# Patient Record
Sex: Male | Born: 1975 | Race: White | Hispanic: No | Marital: Married | Smoking: Former smoker
Health system: Southern US, Community
[De-identification: ages and names within clinical notes are randomized; demographics above are authoritative.]

## PROBLEM LIST (undated history)

## (undated) HISTORY — PX: TYMPANOSTOMY TUBE PLACEMENT: SHX32

---
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
## 2012-06-03 ENCOUNTER — Ambulatory Visit (INDEPENDENT_AMBULATORY_CARE_PROVIDER_SITE_OTHER): Payer: BC Managed Care – PPO | Admitting: Family Medicine

## 2012-06-03 ENCOUNTER — Encounter: Payer: Self-pay | Admitting: Family Medicine

## 2012-06-03 VITALS — BP 108/70 | HR 65 | Temp 97.8°F | Ht 72.5 in | Wt 177.4 lb

## 2012-06-03 DIAGNOSIS — M2141 Flat foot [pes planus] (acquired), right foot: Secondary | ICD-10-CM | POA: Insufficient documentation

## 2012-06-03 DIAGNOSIS — M2142 Flat foot [pes planus] (acquired), left foot: Secondary | ICD-10-CM | POA: Insufficient documentation

## 2012-06-03 DIAGNOSIS — M545 Low back pain: Secondary | ICD-10-CM | POA: Insufficient documentation

## 2012-06-03 DIAGNOSIS — M2140 Flat foot [pes planus] (acquired), unspecified foot: Secondary | ICD-10-CM

## 2012-06-03 MED ORDER — CYCLOBENZAPRINE HCL 10 MG PO TABS: 10.0000 mg | ORAL_TABLET | Freq: Three times a day (TID) | ORAL | Status: DC | PRN

## 2012-06-03 MED ORDER — NAPROXEN 500 MG PO TABS: 500.0000 mg | ORAL_TABLET | Freq: Two times a day (BID) | ORAL | Status: AC

## 2012-06-03 NOTE — Assessment & Plan Note (Signed)
New.  Refer to sports med for custom orthotics like pt had in [place].  Pt expressed understanding and is in agreement w/ plan.

## 2012-06-03 NOTE — Patient Instructions (Addendum)
Schedule your complete physical at your convenience Use the Naproxen (take w/ food) for inflammation and the flexeril as needed for muscle spasm We'll call you with your physical therapy and sports medicine appt for the orthotics Call with any questions or concerns Welcome!  We're glad to have you!!!

## 2012-06-03 NOTE — Progress Notes (Signed)
  Subjective:    Patient ID: Edwin Ryan, male    DOB: 01/09/1976, 37 y.o.   MRN: [id number]  HPI New to establish.  Recently moved from [place].  Works for [employer].  Back pain- pt reports he will have bad posture or hold shoulders high and then develop trap spasm and low back pain.  Was having PT in [place] and would like to continue this here.  Would also like home exercise program.  Periodically will have acute back pain and 'get stuck in the same position x2 days'.  Previously kept supply of anti-inflammatories and muscle relaxers for prn use.  Pt w/ flat feet and has previously required orthotics.  With prolonged running will develop knee and hip pain.  Would like to get new orthotics made.   Review of Systems For ROS see HPI     Objective:   Physical Exam  Vitals reviewed. Constitutional: He is oriented to person, place, and time. He appears well-developed and well-nourished. No distress.  HENT:  Head: Normocephalic and atraumatic.  Eyes: Conjunctivae normal and EOM are normal. Pupils are equal, round, and reactive to light.  Neck: Normal range of motion. Neck supple.  Cardiovascular: Normal rate, regular rhythm and normal heart sounds.   Pulmonary/Chest: Effort normal and breath sounds normal. No respiratory distress. He has no wheezes. He has no rales.  Musculoskeletal:       Full ROM of back (-) SLR bilaterally + mild trap spasm bilaterally FROM of neck  Neurological: He is alert and oriented to person, place, and time. He has normal reflexes. No cranial nerve deficit. Coordination normal.  Skin: Skin is warm and dry.  Psychiatric: He has a normal mood and affect. His behavior is normal.          Assessment & Plan:

## 2012-06-03 NOTE — Assessment & Plan Note (Signed)
New.  Pt w/ hx of this and was receiving PT in [place].  Would like to continue PT and re-establish home exercise plan.  Script for NSAIDs and flexeril given to use prn.  Reviewed supportive care and red flags that should prompt return.  Pt expressed understanding and is in agreement w/ plan.

## 2012-06-11 ENCOUNTER — Ambulatory Visit (INDEPENDENT_AMBULATORY_CARE_PROVIDER_SITE_OTHER): Payer: BC Managed Care – PPO | Admitting: Family Medicine

## 2012-06-11 ENCOUNTER — Encounter: Payer: Self-pay | Admitting: Family Medicine

## 2012-06-11 VITALS — BP 107/70 | HR 54 | Ht 73.0 in | Wt 179.0 lb

## 2012-06-11 DIAGNOSIS — M2140 Flat foot [pes planus] (acquired), unspecified foot: Secondary | ICD-10-CM

## 2012-06-11 DIAGNOSIS — R269 Unspecified abnormalities of gait and mobility: Secondary | ICD-10-CM

## 2012-06-11 DIAGNOSIS — M2141 Flat foot [pes planus] (acquired), right foot: Secondary | ICD-10-CM

## 2012-06-11 DIAGNOSIS — M79671 Pain in right foot: Secondary | ICD-10-CM

## 2012-06-11 DIAGNOSIS — M79609 Pain in unspecified limb: Secondary | ICD-10-CM

## 2012-06-14 ENCOUNTER — Ambulatory Visit: Payer: BC Managed Care – PPO | Attending: Family Medicine

## 2012-06-14 ENCOUNTER — Encounter: Payer: Self-pay | Admitting: Family Medicine

## 2012-06-14 DIAGNOSIS — M79672 Pain in left foot: Secondary | ICD-10-CM | POA: Insufficient documentation

## 2012-06-14 DIAGNOSIS — IMO0001 Reserved for inherently not codable concepts without codable children: Secondary | ICD-10-CM | POA: Insufficient documentation

## 2012-06-14 DIAGNOSIS — R269 Unspecified abnormalities of gait and mobility: Secondary | ICD-10-CM | POA: Insufficient documentation

## 2012-06-14 DIAGNOSIS — R293 Abnormal posture: Secondary | ICD-10-CM | POA: Insufficient documentation

## 2012-06-14 DIAGNOSIS — M2550 Pain in unspecified joint: Secondary | ICD-10-CM | POA: Insufficient documentation

## 2012-06-14 NOTE — Progress Notes (Signed)
Subjective:    Patient ID: Edwin Ryan, male    DOB: [birth year], 37 y.o.   MRN: [id number]  PCP: Dr. Beverely Low  HPI 37 yo M here for orthotics.  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 Through the course of his career he sustained multiple feet and ankle injuries (sprains). Completed physical therapy, nsaids, icing. Went on to have custom orthotics made which helped him significantly. Likes to exercise and run. Having problems with his back - is set to start PT next week as written by Dr. Beverely Low - advised to let me know in a month or so after if he's not improving. Will still occasionally get pain in feet and ankles but this is helped by the orthotics.  History reviewed. No pertinent past medical history.  Current Outpatient Prescriptions on File Prior to Visit  Medication Sig Dispense Refill  . cyclobenzaprine (FLEXERIL) 10 MG tablet Take 1 tablet (10 mg total) by mouth 3 (three) times daily as needed for muscle spasms.  30 tablet  0  . naproxen (NAPROSYN) 500 MG tablet Take 1 tablet (500 mg total) by mouth 2 (two) times daily with a meal.  60 tablet  0   No current facility-administered medications on file prior to visit.    Past Surgical History  Procedure Laterality Date  . Tympanostomy tube placement    . Broken wrist  1988  . Fracture surgery  1993    (L) thumb    No Known Allergies  History   Social History  . Marital Status: Married    Spouse Name: N/A    Number of Children: N/A  . Years of Education: N/A   Occupational History  . Not on file.   Social History Main Topics  . Smoking status: Former Smoker    Types: Cigarettes    Quit date: 04/03/2012  . Smokeless tobacco: Not on file  . Alcohol Use: Yes     Comment: 1 beer or wine per week  . Drug Use: No  . Sexually Active: Not on file   Other Topics Concern  . Not on file   Social History Narrative  . No narrative on file    Family  History  Problem Relation Age of Onset  . High blood pressure Mother   . Hypertension Mother   . High blood pressure Father   . Diabetes Father   . Heart disease Father   . Hypertension Father   . Hyperlipidemia Father   . Cancer Maternal Aunt     stomach,colon  . Cancer Maternal Uncle     lung,prostate  . High blood pressure Maternal Grandmother   . Stroke Maternal Grandmother   . High blood pressure Maternal Grandfather   . High blood pressure Paternal Grandmother   . Stroke Paternal Grandmother   . High blood pressure Paternal Grandfather   . Heart attack Neg Hx   . Sudden death Neg Hx     BP 107/70  Pulse 54  Ht 6\' 1"  (1.854 m)  Wt 179 lb (81.194 kg)  BMI 23.62 kg/m2 Review of Systems See HPI above.    Objective:   Physical Exam Gen: NAD  Bilateral feet/ankles: No gross deformity, swelling, ecchymoses Mod overpronation. No hallux rigidus or valgus. 2nd MT head collapsed with very mild callus bilaterally. FROM ankles without pain, 5/5 strength. No current TTP Negative ant drawer and talar tilt.   Negative syndesmotic compression. Thompsons test negative. NV intact distally.  Leg length 98 cm right, 97.75 cm left.    Assessment & Plan:  1. Bilateral foot/ankle pain - New custom orthotics made today. Patient was fitted for a : standard, cushioned, semi-rigid orthotic. The orthotic was heated and afterward the patient stood on the orthotic blank positioned on the orthotic stand. The patient was positioned in subtalar neutral position and 10 degrees of ankle dorsiflexion in a weight bearing stance. After completion of molding, a stable base was applied to the orthotic blank. The blank was ground to a stable position for weight bearing. Size: 12 blue swirl Base: blue med density EVA Posting: 1st ray posts to both orthotics for stability Additional orthotic padding: None Total prep time 45 minutes Patient felt much more comfortable with these and overpronation  controlled.  2. Gait abnormality with overpronation, pes planus - orthotics as noted above.

## 2012-06-14 NOTE — Assessment & Plan Note (Signed)
New custom orthotics made today. Patient was fitted for a : standard, cushioned, semi-rigid orthotic. The orthotic was heated and afterward the patient stood on the orthotic blank positioned on the orthotic stand. The patient was positioned in subtalar neutral position and 10 degrees of ankle dorsiflexion in a weight bearing stance. After completion of molding, a stable base was applied to the orthotic blank. The blank was ground to a stable position for weight bearing. Size: 12 blue swirl Base: blue med density EVA Posting: 1st ray posts to both orthotics for stability Additional orthotic padding: None Total prep time 45 minutes Patient felt much more comfortable with these and overpronation controlled. Note has a very small leg length inequality - not large enough to warrant lift in left orthotic.

## 2012-06-24 ENCOUNTER — Ambulatory Visit: Payer: BC Managed Care – PPO

## 2012-06-29 ENCOUNTER — Ambulatory Visit: Payer: BC Managed Care – PPO

## 2012-07-01 ENCOUNTER — Ambulatory Visit: Payer: BC Managed Care – PPO

## 2012-07-13 ENCOUNTER — Ambulatory Visit: Payer: BC Managed Care – PPO | Attending: Family Medicine

## 2012-07-13 DIAGNOSIS — R293 Abnormal posture: Secondary | ICD-10-CM | POA: Insufficient documentation

## 2012-07-13 DIAGNOSIS — M2550 Pain in unspecified joint: Secondary | ICD-10-CM | POA: Insufficient documentation

## 2012-07-13 DIAGNOSIS — IMO0001 Reserved for inherently not codable concepts without codable children: Secondary | ICD-10-CM | POA: Insufficient documentation

## 2012-07-14 ENCOUNTER — Encounter: Payer: Self-pay | Admitting: Lab

## 2012-07-15 ENCOUNTER — Ambulatory Visit (INDEPENDENT_AMBULATORY_CARE_PROVIDER_SITE_OTHER): Payer: BC Managed Care – PPO | Admitting: Family Medicine

## 2012-07-15 ENCOUNTER — Encounter: Payer: Self-pay | Admitting: Family Medicine

## 2012-07-15 VITALS — BP 100/70 | HR 64 | Temp 98.0°F | Ht 73.0 in | Wt 180.0 lb

## 2012-07-15 DIAGNOSIS — Z0000 Encounter for general adult medical examination without abnormal findings: Secondary | ICD-10-CM | POA: Insufficient documentation

## 2012-07-15 NOTE — Progress Notes (Signed)
  Subjective:    Patient ID: Edwin Ryan, male    DOB: [birth year], 37 y.o.   MRN: [id number]  HPI CPE- no concerns   Review of Systems Patient reports no vision/hearing changes, anorexia, fever ,adenopathy, persistant/recurrent hoarseness, swallowing issues, chest pain, palpitations, edema, persistant/recurrent cough, hemoptysis, dyspnea (rest,exertional, paroxysmal nocturnal), gastrointestinal  bleeding (melena, rectal bleeding), abdominal pain, excessive heart burn, GU symptoms (dysuria, hematuria, voiding/incontinence issues) syncope, focal weakness, memory loss, numbness & tingling, skin/hair/nail changes, depression, anxiety, abnormal bruising/bleeding, musculoskeletal symptoms/signs.     Objective:   Physical Exam BP 100/70  Pulse 64  Temp(Src) 98 F (36.7 C) (Oral)  Ht 6\' 1"  (1.854 m)  Wt 180 lb (81.647 kg)  BMI 23.75 kg/m2  General Appearance:    Alert, cooperative, no distress, appears stated age  Head:    Normocephalic, without obvious abnormality, atraumatic  Eyes:    PERRL, conjunctiva/corneas clear, EOM's intact, fundi    benign, both eyes       Ears:    Normal TM's and external ear canals, both ears  Nose:   Nares normal, septum midline, mucosa normal, no drainage   or sinus tenderness  Throat:   Lips, mucosa, and tongue normal; teeth and gums normal  Neck:   Supple, symmetrical, trachea midline, no adenopathy;       thyroid:  No enlargement/tenderness/nodules  Back:     Symmetric, no curvature, ROM normal, no CVA tenderness  Lungs:     Clear to auscultation bilaterally, respirations unlabored  Chest wall:    No tenderness or deformity  Heart:    Regular rate and rhythm, S1 and S2 normal, no murmur, rub   or gallop  Abdomen:     Soft, non-tender, bowel sounds active all four quadrants,    no masses, no organomegaly  Genitalia:    Normal male without lesion, discharge or tenderness  Rectal:    Deferred due to young age  Extremities:   Extremities normal,  atraumatic, no cyanosis or edema  Pulses:   2+ and symmetric all extremities  Skin:   Skin color, texture, turgor normal, no rashes or lesions  Lymph nodes:   Cervical, supraclavicular, and axillary nodes normal  Neurologic:   CNII-XII intact. Normal strength, sensation and reflexes      throughout          Assessment & Plan:

## 2012-07-15 NOTE — Patient Instructions (Addendum)
Follow up in 1 year or as needed Keep up the good work!  You look great! We'll notify you of your lab results Happy Spring!  (hopefully!)

## 2012-07-15 NOTE — Assessment & Plan Note (Signed)
Pt's PE WNL.  Check labs.  Anticipatory guidance provided.  

## 2012-07-16 LAB — CBC WITH DIFFERENTIAL/PLATELET
Basophils Absolute: 0 10*3/uL (ref 0.0–0.1)
Lymphs Abs: 1.9 10*3/uL (ref 0.7–4.0)
MCHC: 34.2 g/dL (ref 30.0–36.0)
Monocytes Absolute: 0.4 10*3/uL (ref 0.1–1.0)
Monocytes Relative: 6.5 % (ref 3.0–12.0)
Neutro Abs: 3.2 10*3/uL (ref 1.4–7.7)
Neutrophils Relative %: 58 % (ref 43.0–77.0)
Platelets: 184 10*3/uL (ref 150.0–400.0)
RBC: 4.73 Mil/uL (ref 4.22–5.81)
WBC: 5.5 10*3/uL (ref 4.5–10.5)

## 2012-07-16 LAB — HEPATIC FUNCTION PANEL
Albumin: 4.4 g/dL (ref 3.5–5.2)
Total Protein: 7.2 g/dL (ref 6.0–8.3)

## 2012-07-16 LAB — BASIC METABOLIC PANEL
CO2: 31 mEq/L (ref 19–32)
Calcium: 9.3 mg/dL (ref 8.4–10.5)
Creatinine, Ser: 1.1 mg/dL (ref 0.4–1.5)
GFR: 81.42 mL/min (ref >60.00)
Glucose, Bld: 84 mg/dL (ref 70–99)

## 2012-07-16 LAB — LIPID PANEL
Cholesterol: 155 mg/dL (ref 0–200)
Triglycerides: 205 mg/dL — ABNORMAL HIGH (ref 0.0–149.0)

## 2013-03-10 ENCOUNTER — Other Ambulatory Visit: Payer: Self-pay

## 2013-12-08 ENCOUNTER — Encounter: Payer: Self-pay | Admitting: Medical

## 2013-12-08 ENCOUNTER — Ambulatory Visit (INDEPENDENT_AMBULATORY_CARE_PROVIDER_SITE_OTHER): Payer: BC Managed Care – PPO | Admitting: Medical

## 2013-12-08 VITALS — BP 95/64 | HR 55 | Temp 98.4°F | Wt 195.0 lb

## 2013-12-08 DIAGNOSIS — J329 Chronic sinusitis, unspecified: Secondary | ICD-10-CM

## 2013-12-08 DIAGNOSIS — B9689 Other specified bacterial agents as the cause of diseases classified elsewhere: Secondary | ICD-10-CM

## 2013-12-08 DIAGNOSIS — A499 Bacterial infection, unspecified: Secondary | ICD-10-CM

## 2013-12-08 DIAGNOSIS — J309 Allergic rhinitis, unspecified: Secondary | ICD-10-CM

## 2013-12-08 MED ORDER — FLUTICASONE PROPIONATE 50 MCG/ACT NA SUSP: 2.0000 | Freq: Every day | NASAL | Status: DC

## 2013-12-08 MED ORDER — BENZONATATE 100 MG PO CAPS: 100.0000 mg | ORAL_CAPSULE | Freq: Three times a day (TID) | ORAL | Status: DC | PRN

## 2013-12-08 MED ORDER — CEFDINIR 300 MG PO CAPS: 300.0000 mg | ORAL_CAPSULE | Freq: Two times a day (BID) | ORAL | Status: DC

## 2013-12-08 NOTE — Assessment & Plan Note (Signed)
Following persistent untreated  allergy symptoms. Rx cefdnir. Also possible bronchitis. Rx of benzonatate for cough.

## 2013-12-08 NOTE — Patient Instructions (Signed)
It appears you have allergic rhinitis symptoms that  Have persisted  And now have sinusitis as well as probable bronchitis. I am sending you a prescription of fluticasone nasal spray to your pharmacy as well as a antibiotic and cough tablet. Please take medication. Follow up in 7 days for persisting symptoms or as needed for any worsening or changing symptoms.

## 2013-12-08 NOTE — Progress Notes (Signed)
Pre visit review using our clinic review tool, if applicable. No additional management support is needed unless otherwise documented below in the visit note. 

## 2013-12-08 NOTE — Assessment & Plan Note (Signed)
Rx fluticasone nasal spray and can get otc antihistamine if needed.

## 2013-12-08 NOTE — Progress Notes (Signed)
Subjective:    Patient ID: Edwin Ryan, male    DOB: [birth year], 38 y.o.   MRN: [id number]  HPI  Pt in sick for 7-10 days. He got sick on vacation. Start with nasal congestion, sneezing,  runny nose, sinus pressure and then progressive chest congestion. Now for 2 days coughing up mucous. Pt able to sleep.   Maybe some allergies in spring.   No past medical history on file.  History   Social History  . Marital Status: Married    Spouse Name: N/A    Number of Children: N/A  . Years of Education: N/A   Occupational History  . Not on file.   Social History Main Topics  . Smoking status: Former Smoker    Types: Cigarettes    Quit date: 04/03/2012  . Smokeless tobacco: Not on file  . Alcohol Use: Yes     Comment: 1 beer or wine per week  . Drug Use: No  . Sexual Activity: Not on file   Other Topics Concern  . Not on file   Social History Narrative  . No narrative on file    Past Surgical History  Procedure Laterality Date  . Tympanostomy tube placement    . Broken wrist  1988  . Fracture surgery  1993    (L) thumb    Family History  Problem Relation Age of Onset  . High blood pressure Mother   . Hypertension Mother   . High blood pressure Father   . Diabetes Father   . Heart disease Father   . Hypertension Father   . Hyperlipidemia Father   . Cancer Maternal Aunt     stomach,colon  . Cancer Maternal Uncle     lung,prostate  . High blood pressure Maternal Grandmother   . Stroke Maternal Grandmother   . High blood pressure Maternal Grandfather   . High blood pressure Paternal Grandmother   . Stroke Paternal Grandmother   . High blood pressure Paternal Grandfather   . Heart attack Neg Hx   . Sudden death Neg Hx     No Known Allergies  Current Outpatient Prescriptions on File Prior to Visit  Medication Sig Dispense Refill  . cyclobenzaprine (FLEXERIL) 10 MG tablet Take 1 tablet (10 mg total) by mouth 3 (three) times daily as needed for muscle  spasms.  30 tablet  0   No current facility-administered medications on file prior to visit.    BP 95/64  Pulse 55  Temp(Src) 98.4 F (36.9 C)  Wt 195 lb (88.451 kg)  SpO2 97%    Review of Systems  Constitutional: Negative for fever, chills and fatigue.  HENT: Positive for congestion, rhinorrhea, sinus pressure and sneezing. Negative for ear discharge, ear pain, facial swelling, mouth sores, postnasal drip, sore throat and tinnitus.   Respiratory: Positive for cough. Negative for chest tightness, shortness of breath and wheezing.   Cardiovascular: Negative for chest pain and palpitations.  Musculoskeletal: Negative.   Skin: Negative.   Neurological: Negative for dizziness, syncope, facial asymmetry, weakness, light-headedness, numbness and headaches.  Hematological: Negative for adenopathy. Does not bruise/bleed easily.       Objective:   Physical Exam  Constitutional: He is oriented to person, place, and time. He appears well-developed and well-nourished. No distress.  HENT:  Head: Normocephalic and atraumatic.  Right Ear: External ear normal.  Left Ear: External ear normal.  Mouth/Throat: Oropharynx is clear and moist.  Boggy turbinates. Faint maxillary sinus pressure. Post nasal drainage.  Eyes: Conjunctivae are normal. Pupils are equal, round, and reactive to light. Right eye exhibits no discharge. Left eye exhibits no discharge.  Neck: Normal range of motion. Neck supple.  Cardiovascular: Normal rate, regular rhythm and normal heart sounds.   Pulmonary/Chest: Effort normal and breath sounds normal. No respiratory distress. He has no wheezes. He has no rales. He exhibits no tenderness.  Abdominal: Soft. Bowel sounds are normal.  Neurological: He is alert and oriented to person, place, and time. No cranial nerve deficit. Coordination normal.  Skin: He is not diaphoretic.          Assessment & Plan:

## 2014-02-22 ENCOUNTER — Ambulatory Visit: Payer: BC Managed Care – PPO | Admitting: Family Medicine

## 2014-05-09 ENCOUNTER — Ambulatory Visit: Payer: Self-pay | Admitting: Family Medicine

## 2014-05-09 ENCOUNTER — Encounter: Payer: Self-pay | Admitting: Internal Medicine

## 2014-05-09 ENCOUNTER — Ambulatory Visit (INDEPENDENT_AMBULATORY_CARE_PROVIDER_SITE_OTHER): Payer: BLUE CROSS/BLUE SHIELD | Admitting: Internal Medicine

## 2014-05-09 VITALS — BP 117/76 | HR 62 | Temp 97.8°F | Ht 73.0 in | Wt 198.0 lb

## 2014-05-09 DIAGNOSIS — M545 Low back pain, unspecified: Secondary | ICD-10-CM

## 2014-05-09 MED ORDER — CYCLOBENZAPRINE HCL 10 MG PO TABS: 10.0000 mg | ORAL_TABLET | Freq: Every evening | ORAL | Status: DC | PRN

## 2014-05-09 MED ORDER — DICLOFENAC SODIUM 50 MG PO TBEC: 50.0000 mg | DELAYED_RELEASE_TABLET | Freq: Three times a day (TID) | ORAL | Status: DC | PRN

## 2014-05-09 NOTE — Patient Instructions (Signed)
Rest, use warm compress  Diclofenac 3 times a daya as needed for pain.  Always take it with food because may cause gastritis and ulcers.  If you notice nausea, stomach pain, change in the color of stools --->  Stop the medicine and let us know  Flexeril at bedtime, will cause drowsiness  Call if not improving in the next 10 days  Back exercises  [place] with information about home physical therapy for back pain: [email or web address]

## 2014-05-09 NOTE — Progress Notes (Signed)
Pre visit review using our clinic review tool, if applicable. No additional management support is needed unless otherwise documented below in the visit note. 

## 2014-05-09 NOTE — Progress Notes (Signed)
   Subjective:    Patient ID: Edwin Ryan, male    DOB: 05-03-1976, 39 y.o.   MRN: [id number]  DOS:  05/09/2014 Type of visit - description : acute Interval history: Symptoms started yesterday as he was standing up from a chair: Bilateral low back pain described as cramps, since then he is taking ibuprofen with some relief. This is not the first time this happened. No radiation.  ROS Denies actual injury or fall No lower extremity paresthesias No dysuria, gross hematuria difficulty urinating  History reviewed. No pertinent past medical history.  Past Surgical History  Procedure Laterality Date  . Tympanostomy tube placement    . Broken wrist  1988  . Fracture surgery  1993    (L) thumb    History   Social History  . Marital Status: Married    Spouse Name: N/A    Number of Children: N/A  . Years of Education: N/A   Occupational History  . Not on file.   Social History Main Topics  . Smoking status: Former Smoker    Types: Cigarettes    Quit date: 04/03/2012  . Smokeless tobacco: Not on file  . Alcohol Use: Yes     Comment: 1 beer or wine per week  . Drug Use: No  . Sexual Activity: Not on file   Other Topics Concern  . Not on file   Social History Narrative        Medication List       This list is accurate as of: 05/09/14 11:59 PM.  Always use your most recent med list.               cyclobenzaprine 10 MG tablet  Commonly known as:  FLEXERIL  Take 1 tablet (10 mg total) by mouth at bedtime as needed for muscle spasms.     diclofenac 50 MG EC tablet  Commonly known as:  VOLTAREN  Take 1 tablet (50 mg total) by mouth 3 (three) times daily as needed.     fluticasone 50 MCG/ACT nasal spray  Commonly known as:  FLONASE  Place 2 sprays into both nostrils daily.           Objective:   Physical Exam BP 117/76 mmHg  Pulse 62  Temp(Src) 97.8 F (36.6 C) (Oral)  Ht 6' 1"  (1.854 m)  Wt 198 lb (89.812 kg)  BMI 26.13 kg/m2  SpO2 96% General  -- alert, well-developed, NAD.  Extremities-- no pretibial edema bilaterally  Neurologic--  alert & oriented X3. Speech normal, posture and gait slightly antalgic, strength symmetric and appropriate for age. Straight leg test negative. DTRs symmetric. Psych-- Cognition and judgment appear intact. Cooperative with normal attention span and concentration. No anxious or depressed appearing.        Assessment & Plan:

## 2014-05-10 NOTE — Assessment & Plan Note (Signed)
Lumbar sprain, Conservative treatment, in the past the anti-inflammatory that worked the best for him was diclofenac, GI precautions discussed Flexeril Recommend also warm compresses Also physical therapy, see instructions, call if no better in few days

## 2014-12-07 ENCOUNTER — Encounter: Payer: Self-pay | Admitting: Medical

## 2014-12-07 ENCOUNTER — Ambulatory Visit (INDEPENDENT_AMBULATORY_CARE_PROVIDER_SITE_OTHER): Payer: BLUE CROSS/BLUE SHIELD | Admitting: Medical

## 2014-12-07 VITALS — BP 117/65 | HR 62 | Temp 98.3°F | Ht 73.0 in | Wt 186.8 lb

## 2014-12-07 DIAGNOSIS — J029 Acute pharyngitis, unspecified: Secondary | ICD-10-CM

## 2014-12-07 DIAGNOSIS — H9202 Otalgia, left ear: Secondary | ICD-10-CM

## 2014-12-07 DIAGNOSIS — H6502 Acute serous otitis media, left ear: Secondary | ICD-10-CM

## 2014-12-07 DIAGNOSIS — R0789 Other chest pain: Secondary | ICD-10-CM | POA: Diagnosis not present

## 2014-12-07 MED ORDER — AMOXICILLIN-POT CLAVULANATE 875-125 MG PO TABS: 1.0000 | ORAL_TABLET | Freq: Two times a day (BID) | ORAL | Status: DC

## 2014-12-07 NOTE — Progress Notes (Signed)
Pre visit review using our clinic review tool, if applicable. No additional management support is needed unless otherwise documented below in the visit note. 

## 2014-12-07 NOTE — Patient Instructions (Addendum)
For your lt ear looks infected I am prescribing augmentin antibiotic.  Your throat does not look that suspicious for infection presently. But antibiotic for your ear would help in the event you started with strep while you were on vacation.  For you mild atypical transient chest pain will get ekg. Your ekg looks norma(only brady cardia but resting ekg in person who regularly exercises)l. It is good that you can exercise and you note this does not induce pain. If your pain returns then would recommend evaluation at the time to get ekg while symptomatic. If after hour chest pain then ED.  If in future any recurrent intermittent chest pain. Then would consider referal to cardiology for possible stress testing or holter.  Follow up in 10 days or as needed  Also recommend wellness exam fasting so we can get labs and assess cardiac risk factors/lipid panel.

## 2014-12-07 NOTE — Progress Notes (Signed)
Subjective:    Patient ID: Edwin Ryan, male    DOB: 01-07-1976, 39 y.o.   MRN: [id number]  HPI   Recent vacation had some st on left side. Then left ear pain. Those symptoms about 2 wks ago. Throat still feels mild sore. Pt states his throat pain has been persisting. Left side still hurts mildly. Some intermittent dizziness over past 2 wks. But not now.  3 wks ago mild transient chest pain 5-10 minutes early am. Then went away. No description of classic cardiac type symptoms or associated symptoms. Then this went away. Then this Monday he had mild transient pain last 5 minutes then went away.  Recent air flights. But no leg pain. No sob or wheezing  Review of Systems  Constitutional: Negative for fever, chills, diaphoresis, activity change and fatigue.  HENT: Positive for ear pain and sore throat. Negative for congestion, drooling, postnasal drip, rhinorrhea, tinnitus and voice change.   Respiratory: Negative for cough, chest tightness and shortness of breath.   Cardiovascular: Negative for chest pain, palpitations and leg swelling.       None presently. Atypical chest pain 3 wk ago. This Monday. Very mild and limited.  Gastrointestinal: Negative for nausea, vomiting and abdominal pain.  Musculoskeletal: Negative for neck pain and neck stiffness.  Neurological: Negative for dizziness, tremors, seizures, syncope, facial asymmetry, speech difficulty, weakness, light-headedness, numbness and headaches.  Psychiatric/Behavioral: Negative for behavioral problems, confusion and agitation. The patient is not nervous/anxious.     No past medical history on file.  History   Social History  . Marital Status: Married    Spouse Name: N/A  . Number of Children: N/A  . Years of Education: N/A   Occupational History  . Not on file.   Social History Main Topics  . Smoking status: Former Smoker    Types: Cigarettes    Quit date: 04/03/2012  . Smokeless tobacco: Not on file  . Alcohol  Use: Yes     Comment: 1 beer or wine per week  . Drug Use: No  . Sexual Activity: Not on file   Other Topics Concern  . Not on file   Social History Narrative    Past Surgical History  Procedure Laterality Date  . Tympanostomy tube placement    . Broken wrist  1988  . Fracture surgery  1993    (L) thumb    Family History  Problem Relation Age of Onset  . High blood pressure Mother   . Hypertension Mother   . High blood pressure Father   . Diabetes Father   . Heart disease Father   . Hypertension Father   . Hyperlipidemia Father   . Cancer Maternal Aunt     stomach,colon  . Cancer Maternal Uncle     lung,prostate  . High blood pressure Maternal Grandmother   . Stroke Maternal Grandmother   . High blood pressure Maternal Grandfather   . High blood pressure Paternal Grandmother   . Stroke Paternal Grandmother   . High blood pressure Paternal Grandfather   . Heart attack Neg Hx   . Sudden death Neg Hx     No Known Allergies  No current outpatient prescriptions on file prior to visit.   No current facility-administered medications on file prior to visit.    BP 117/65 mmHg  Pulse 62  Temp(Src) 98.3 F (36.8 C) (Oral)  Ht 6' 1"  (1.854 m)  Wt 186 lb 12.8 oz (84.732 kg)  BMI 24.65 kg/m2  SpO2 100%       Objective:   Physical Exam  General  Mental Status - Alert. General Appearance - Well groomed. Not in acute distress.  Skin Rashes- No Rashes.  HEENT Head- Normal. Ear Auditory Canal - Left- Normal. Right - Normal.Tympanic Membrane- Left- moderate bright red upper 2/3 portion of tm Right- half of tm mild red appearance. Eye Sclera/Conjunctiva- Left- Normal. Right- Normal. Nose & Sinuses Nasal Mucosa- Left-  Not boggy or Congested. Right-  Not  boggy or Congested. Mouth & Throat Lips: Upper Lip- Normal: no dryness, cracking, pallor, cyanosis, or vesicular eruption. Lower Lip-Normal: no dryness, cracking, pallor, cyanosis or vesicular  eruption. Buccal Mucosa- Bilateral- No Aphthous ulcers. Oropharynx- No Discharge or Erythema. Tonsils: Characteristics- Bilateral- faint  Erythema . Size/Enlargement- Bilateral- 1+ enlargement at best. Discharge- bilateral-None.  Neck Neck- Supple. No Masses.   Chest and Lung Exam Auscultation: Breath Sounds:- even and unlabored, but bilateral upper lobe rhonchi.  Cardiovascular Auscultation:Rythm- Regular, rate and rhythm. Murmurs & Other Heart Sounds:Ausculatation of the heart reveal- No Murmurs.  Lymphatic Head & Neck General Head & Neck Lymphatics: Bilateral: Description- No Localized lymphadenopathy. Of neck.  Anterior chest wall- not tender to palpation.       Assessment & Plan:  For your lt ear looks infected I am prescribing augmentin antibiotic.  Your throat does not look that suspicious for infection presently. But antibiotic for your ear would help in the event you started with strep while you were on vacation.  For you mild atypical transient chest pain will get ekg. Your ekg looks norma(only brady cardia but resting ekg in person who regularly exercises)l. It is good that you can exercise and you note this does not induce pain. If your pain returns then would recommend evaluation at the time to get ekg while symptomatic. If after hour chest pain then ED.  If in future any recurrent intermittent chest pain. Then would consider referal to cardiology for possible stress testing or holter.  Follow up in 10 days or as needed  Also recommend wellness exam fasting so we can get labs and assess cardiac risk factors/lipid panel.

## 2015-07-03 ENCOUNTER — Telehealth: Payer: Self-pay | Admitting: Family Medicine

## 2015-07-03 NOTE — Telephone Encounter (Signed)
Pt declined flu shot

## 2015-07-03 NOTE — Telephone Encounter (Signed)
lvm for pt to call back regarding flu shot

## 2015-07-03 NOTE — Telephone Encounter (Signed)
Chart updated to reflect 

## 2016-07-25 ENCOUNTER — Telehealth: Payer: Self-pay | Admitting: Family Medicine

## 2016-07-25 NOTE — Telephone Encounter (Signed)
Pt would like to transfer care from Dr. Birdie Riddle to [place], Ronald states that he would like to have a male provider, please advise ok to schedule.

## 2016-07-25 NOTE — Telephone Encounter (Signed)
Ok with me 

## 2016-07-25 NOTE — Telephone Encounter (Signed)
[place] w/ me

## 2016-07-25 NOTE — Telephone Encounter (Signed)
Called pt and LMOVM to return call.

## 2016-07-29 ENCOUNTER — Encounter: Payer: Self-pay | Admitting: Physician Assistant

## 2016-07-29 ENCOUNTER — Ambulatory Visit (INDEPENDENT_AMBULATORY_CARE_PROVIDER_SITE_OTHER): Payer: BLUE CROSS/BLUE SHIELD | Admitting: Physician Assistant

## 2016-07-29 VITALS — BP 102/64 | HR 76 | Temp 98.5°F | Resp 14 | Ht 74.0 in | Wt 188.0 lb

## 2016-07-29 DIAGNOSIS — M79604 Pain in right leg: Secondary | ICD-10-CM

## 2016-07-29 DIAGNOSIS — M25551 Pain in right hip: Secondary | ICD-10-CM | POA: Insufficient documentation

## 2016-07-29 DIAGNOSIS — Z0000 Encounter for general adult medical examination without abnormal findings: Secondary | ICD-10-CM

## 2016-07-29 DIAGNOSIS — M79641 Pain in right hand: Secondary | ICD-10-CM | POA: Diagnosis not present

## 2016-07-29 LAB — URINALYSIS, ROUTINE W REFLEX MICROSCOPIC
Bilirubin Urine: NEGATIVE
Hgb urine dipstick: NEGATIVE
Ketones, ur: NEGATIVE
Leukocytes, UA: NEGATIVE
NITRITE: NEGATIVE
RBC / HPF: NONE SEEN (ref 0–?)
SPECIFIC GRAVITY, URINE: 1.01 (ref 1.000–1.030)
Total Protein, Urine: NEGATIVE
Urine Glucose: NEGATIVE
Urobilinogen, UA: 0.2 (ref 0.0–1.0)
WBC UA: NONE SEEN (ref 0–?)
pH: 7 (ref 5.0–8.0)

## 2016-07-29 LAB — COMPREHENSIVE METABOLIC PANEL
ALBUMIN: 4.8 g/dL (ref 3.5–5.2)
ALT: 28 U/L (ref 0–53)
AST: 18 U/L (ref 0–37)
Alkaline Phosphatase: 54 U/L (ref 39–117)
BUN: 19 mg/dL (ref 6–23)
CHLORIDE: 103 meq/L (ref 96–112)
CO2: 34 mEq/L — ABNORMAL HIGH (ref 19–32)
Calcium: 10.2 mg/dL (ref 8.4–10.5)
Creatinine, Ser: 0.98 mg/dL (ref 0.40–1.50)
GFR: 89.23 mL/min (ref >60.00)
Glucose, Bld: 88 mg/dL (ref 70–99)
POTASSIUM: 4.8 meq/L (ref 3.5–5.1)
Sodium: 141 mEq/L (ref 135–145)
Total Bilirubin: 0.7 mg/dL (ref 0.2–1.2)
Total Protein: 7.3 g/dL (ref 6.0–8.3)

## 2016-07-29 LAB — TSH: TSH: 1.83 u[IU]/mL (ref 0.35–4.50)

## 2016-07-29 LAB — LIPID PANEL
CHOL/HDL RATIO: 4
Cholesterol: 178 mg/dL (ref 0–200)
HDL: 47 mg/dL (ref >39.00)
LDL Cholesterol: 104 mg/dL — ABNORMAL HIGH (ref 0–99)
NonHDL: 130.51
TRIGLYCERIDES: 134 mg/dL (ref 0.0–149.0)
VLDL: 26.8 mg/dL (ref 0.0–40.0)

## 2016-07-29 LAB — CBC
HEMATOCRIT: 43.7 % (ref 39.0–52.0)
Hemoglobin: 14.9 g/dL (ref 13.0–17.0)
MCHC: 34 g/dL (ref 30.0–36.0)
MCV: 90 fl (ref 78.0–100.0)
Platelets: 176 10*3/uL (ref 150.0–400.0)
RBC: 4.86 Mil/uL (ref 4.22–5.81)
RDW: 13.2 % (ref 11.5–15.5)
WBC: 4.4 10*3/uL (ref 4.0–10.5)

## 2016-07-29 NOTE — Patient Instructions (Signed)
Please go to the lab for blood work.   Our office will call you with your results unless you have chosen to receive results via MyChart.  If your blood work is normal we will follow-up each year for physicals and as scheduled for chronic medical problems.  If anything is abnormal we will treat accordingly and get you in for a follow-up.  You will be contacted for assessment by Sports Medicine. Please wear the brace daily as directed. Try a daily Aleve over the next week. Cut out sports activity until your assessment with Dr. Barbaraann Barthel.   Preventive Care 40-64 Years, Male Preventive care refers to lifestyle choices and visits with your health care provider that can promote health and wellness. What does preventive care include?  A yearly physical exam. This is also called an annual well check.  Dental exams once or twice a year.  Routine eye exams. Ask your health care provider how often you should have your eyes checked.  Personal lifestyle choices, including:  Daily care of your teeth and gums.  Regular physical activity.  Eating a healthy diet.  Avoiding tobacco and drug use.  Limiting alcohol use.  Practicing safe sex.  Taking low-dose aspirin every day starting at age 55. What happens during an annual well check? The services and screenings done by your health care provider during your annual well check will depend on your age, overall health, lifestyle risk factors, and family history of disease. Counseling  Your health care provider may ask you questions about your:  Alcohol use.  Tobacco use.  Drug use.  Emotional well-being.  Home and relationship well-being.  Sexual activity.  Eating habits.  Work and work Statistician. Screening  You may have the following tests or measurements:  Height, weight, and BMI.  Blood pressure.  Lipid and cholesterol levels. These may be checked every 5 years, or more frequently if you are over 28 years old.  Skin  check.  Lung cancer screening. You may have this screening every year starting at age 45 if you have a 30-pack-year history of smoking and currently smoke or have quit within the past 15 years.  Fecal occult blood test (FOBT) of the stool. You may have this test every year starting at age 8.  Flexible sigmoidoscopy or colonoscopy. You may have a sigmoidoscopy every 5 years or a colonoscopy every 10 years starting at age 38.  Prostate cancer screening. Recommendations will vary depending on your family history and other risks.  Hepatitis C blood test.  Hepatitis B blood test.  Sexually transmitted disease (STD) testing.  Diabetes screening. This is done by checking your blood sugar (glucose) after you have not eaten for a while (fasting). You may have this done every 1-3 years. Discuss your test results, treatment options, and if necessary, the need for more tests with your health care provider. Vaccines  Your health care provider may recommend certain vaccines, such as:  Influenza vaccine. This is recommended every year.  Tetanus, diphtheria, and acellular pertussis (Tdap, Td) vaccine. You may need a Td booster every 10 years.  Varicella vaccine. You may need this if you have not been vaccinated.  Zoster vaccine. You may need this after age 64.  Measles, mumps, and rubella (MMR) vaccine. You may need at least one dose of MMR if you were born in 1957 or later. You may also need a second dose.  Pneumococcal 13-valent conjugate (PCV13) vaccine. You may need this if you have certain conditions and have  not been vaccinated.  Pneumococcal polysaccharide (PPSV23) vaccine. You may need one or two doses if you smoke cigarettes or if you have certain conditions.  Meningococcal vaccine. You may need this if you have certain conditions.  Hepatitis A vaccine. You may need this if you have certain conditions or if you travel or work in places where you may be exposed to hepatitis  A.  Hepatitis B vaccine. You may need this if you have certain conditions or if you travel or work in places where you may be exposed to hepatitis B.  Haemophilus influenzae type b (Hib) vaccine. You may need this if you have certain risk factors. Talk to your health care provider about which screenings and vaccines you need and how often you need them. This information is not intended to replace advice given to you by your health care provider. Make sure you discuss any questions you have with your health care provider. Document Released: 05/18/2015 Document Revised: 01/09/2016 Document Reviewed: 02/20/2015 Elsevier Interactive Patient Education  2017 [employer].

## 2016-07-29 NOTE — Assessment & Plan Note (Signed)
R hip flexors. Likely strain. No palpable herniation on examination. Start Aleve. Will stop soccer at present. Referral to Sports Medicine placed for further assessment.

## 2016-07-29 NOTE — Progress Notes (Signed)
Patient presents to clinic today for annual exam.  Patient is fasting for labs. Body mass index is 24.14 kg/m. Patient stays very active -- is exercising several times per week. Is playing soccer and running throughout the week. Endorses well-balanced diet overall.  Acute Concerns: R lateral thumb -- pain x 6 months. Aching in nature. Hurts with certain ROM. Swollen at onset but none since then. Denies recent trauma to the region. Denies skin changes, numbness or tingling.  R thing/inguinal region -- off and on for 4-5 months. Pain with flexion of R hip and aching. Rest for 2-3 weeks with improvement. Then with resuming activity, gets flare up of pain. Pain is aching and deep. Intermittent. Denies mass. Denies trauma or injury. Denies numbness, tingling.  Health Maintenance: Immunizations -- Declines flu shot. Tetanus is up-to-date. HIV Screening -- Had in 20s. Denies any concern for recheck.   History reviewed. No pertinent past medical history.  Past Surgical History:  Procedure Laterality Date  . broken wrist  1988  . FRACTURE SURGERY  1993   (L) thumb  . TYMPANOSTOMY TUBE PLACEMENT      No current outpatient prescriptions on file prior to visit.   No current facility-administered medications on file prior to visit.     No Known Allergies  Family History  Problem Relation Age of Onset  . High blood pressure Mother   . Hypertension Mother   . High blood pressure Father   . Diabetes Father   . Heart disease Father   . Hypertension Father   . Hyperlipidemia Father   . Stroke Father   . Cancer Maternal Aunt     stomach,colon  . Cancer Maternal Uncle     lung,prostate  . High blood pressure Maternal Grandmother   . Stroke Maternal Grandmother   . High blood pressure Maternal Grandfather   . High blood pressure Paternal Grandmother   . Stroke Paternal Grandmother   . High blood pressure Paternal Grandfather   . Heart attack Neg Hx   . Sudden death Neg Hx      Social History   Social History  . Marital status: Married    Spouse name: N/A  . Number of children: N/A  . Years of education: N/A   Occupational History  . Not on file.   Social History Main Topics  . Smoking status: Former Smoker    Types: Cigarettes    Quit date: 04/03/2012  . Smokeless tobacco: Never Used  . Alcohol use Yes     Comment: 1 beer or wine per week  . Drug use: No  . Sexual activity: Yes   Other Topics Concern  . Not on file   Social History Narrative  . No narrative on file    Review of Systems  Constitutional: Negative for fever and weight loss.  HENT: Negative for ear discharge, ear pain, hearing loss and tinnitus.   Eyes: Negative for blurred vision, double vision, photophobia and pain.  Respiratory: Negative for cough and shortness of breath.   Cardiovascular: Negative for chest pain and palpitations.  Gastrointestinal: Negative for abdominal pain, blood in stool, constipation, diarrhea, heartburn, melena, nausea and vomiting.  Genitourinary: Negative for dysuria, flank pain, frequency, hematuria and urgency.  Musculoskeletal: Positive for joint pain. Negative for falls.  Neurological: Negative for dizziness, loss of consciousness and headaches.  Endo/Heme/Allergies: Negative for environmental allergies.  Psychiatric/Behavioral: Negative for depression, hallucinations, substance abuse and suicidal ideas. The patient is not nervous/anxious and does not  have insomnia.    BP 102/64   Pulse 76   Temp 98.5 F (36.9 C) (Oral)   Resp 14   Ht 6' 2"  (1.88 m)   Wt 188 lb (85.3 kg)   SpO2 98%   BMI 24.14 kg/m   Physical Exam  Constitutional: He is oriented to person, place, and time and well-developed, well-nourished, and in no distress.  HENT:  Head: Normocephalic and atraumatic.  Right Ear: External ear normal.  Left Ear: External ear normal.  Nose: Nose normal.  Mouth/Throat: Oropharynx is clear and moist. No oropharyngeal exudate.   Eyes: Conjunctivae and EOM are normal. Pupils are equal, round, and reactive to light.  Neck: Neck supple. No thyromegaly present.  Cardiovascular: Normal rate, regular rhythm, normal heart sounds and intact distal pulses.   Pulmonary/Chest: Effort normal and breath sounds normal. No respiratory distress. He has no wheezes. He has no rales. He exhibits no tenderness.  Abdominal: Soft. Bowel sounds are normal. He exhibits no distension and no mass. There is no tenderness. There is no rebound and no guarding. Hernia confirmed negative in the umbilical area, confirmed negative in the ventral area and confirmed negative in the right inguinal area.  Genitourinary: Testes/scrotum normal and penis normal. No discharge found.  Musculoskeletal:       Right hip: He exhibits normal range of motion and normal strength.       Right hand: He exhibits tenderness. Normal sensation noted. Normal strength noted.  Questionable Finklestein test.  Pain with flexion of R hip. No pain with abduction, adduction, internal/eternal rotation or extension. Full range of motion is present. Strength or hip is 5/5  Lymphadenopathy:    He has no cervical adenopathy.  Neurological: He is alert and oriented to person, place, and time.  Skin: Skin is warm and dry. No rash noted.  Psychiatric: Affect normal.  Vitals reviewed.  Assessment/Plan: Visit for preventive health examination Depression screen negative. Health Maintenance reviewed -- Tetanus up-to-date. Flu shot declined.  Preventive schedule discussed and handout given in AVS. Will obtain fasting labs today.   Right leg pain R hip flexors. Likely strain. No palpable herniation on examination. Start Aleve. Will stop soccer at present. Referral to Sports Medicine placed for further assessment.  Right hand pain Questionable de quervain's. Start thumb spica splint for immobilization. Sports medicine referral at placed at patient request. RICE. NSAIDs  reviewed.    Leeanne Rio, PA-C

## 2016-07-29 NOTE — Assessment & Plan Note (Signed)
Questionable de quervain's. Start thumb spica splint for immobilization. Sports medicine referral at placed at patient request. RICE. NSAIDs reviewed.

## 2016-07-29 NOTE — Progress Notes (Signed)
Pre visit review using our clinic review tool, if applicable. No additional management support is needed unless otherwise documented below in the visit note. 

## 2016-07-29 NOTE — Assessment & Plan Note (Signed)
Depression screen negative. Health Maintenance reviewed -- Tetanus up-to-date. Flu shot declined.  Preventive schedule discussed and handout given in AVS. Will obtain fasting labs today.

## 2016-08-07 ENCOUNTER — Ambulatory Visit (INDEPENDENT_AMBULATORY_CARE_PROVIDER_SITE_OTHER): Payer: BLUE CROSS/BLUE SHIELD | Admitting: Family Medicine

## 2016-08-07 ENCOUNTER — Ambulatory Visit (HOSPITAL_BASED_OUTPATIENT_CLINIC_OR_DEPARTMENT_OTHER)
Admission: RE | Admit: 2016-08-07 | Discharge: 2016-08-07 | Disposition: A | Discharge status: Home / Self Care | Payer: BLUE CROSS/BLUE SHIELD | Source: Ambulatory Visit | Attending: Family Medicine | Admitting: Family Medicine

## 2016-08-07 ENCOUNTER — Encounter: Payer: Self-pay | Admitting: Family Medicine

## 2016-08-07 VITALS — BP 113/71 | HR 61 | Ht 73.0 in | Wt 185.0 lb

## 2016-08-07 DIAGNOSIS — M25551 Pain in right hip: Secondary | ICD-10-CM

## 2016-08-07 DIAGNOSIS — M79644 Pain in right finger(s): Secondary | ICD-10-CM | POA: Diagnosis not present

## 2016-08-07 DIAGNOSIS — S6991XA Unspecified injury of right wrist, hand and finger(s), initial encounter: Secondary | ICD-10-CM | POA: Diagnosis not present

## 2016-08-07 DIAGNOSIS — M7989 Other specified soft tissue disorders: Secondary | ICD-10-CM | POA: Insufficient documentation

## 2016-08-07 DIAGNOSIS — G8929 Other chronic pain: Secondary | ICD-10-CM | POA: Diagnosis not present

## 2016-08-07 DIAGNOSIS — R937 Abnormal findings on diagnostic imaging of other parts of musculoskeletal system: Secondary | ICD-10-CM | POA: Insufficient documentation

## 2016-08-07 NOTE — Patient Instructions (Signed)
Your x-rays suggest you did have a small fracture of your thumb though at this point there isn't much to recommend for this. I don't think this far out splinting, bracing would help you. Occasionally people will do occupational therapy to help strengthen around the thumb so this is an option in the future though I don't think it is necessary. Icing, tylenol, ibuprofen only if needed.  Your history, exam, reassuring x-rays are consistent with a sports hernia. Start physical therapy for this. Icing if needed 15 minutes at a time 3-4 times a day. Avoid abrupt twisting, cutting, heavy lifting, sprinting, or any other painful activities for the next 6 weeks. Ibuprofen 672m three times a day with food if needed. Follow up with me after you've completed 6 weeks of physical therapy.

## 2016-08-12 DIAGNOSIS — S6991XA Unspecified injury of right wrist, hand and finger(s), initial encounter: Secondary | ICD-10-CM | POA: Insufficient documentation

## 2016-08-12 NOTE — Assessment & Plan Note (Signed)
independently reviewed radiographs and no evidence avulsion fracture, other bony abnormalities.  Consistent with sports hernia based on location, tenderness, very small bulge on valsalva.  Start physical therapy, home exercises.  Icing, discussed relative rest.  Ibuprofen if needed.  F/u in 6 weeks.

## 2016-08-12 NOTE — Progress Notes (Signed)
PCP: Leeanne Rio, PA-C  Subjective:   HPI: Patient is a 41 y.o. male here for right thumb, leg pain.  Patient reports about 6 months ago he suffered an injury to right thumb where it was forced into flexion. He had pain, swelling. This has very slowly improved but he still has soreness here to 3/10 level at worst with motions of thumb. Also with pain in right hip area - was playing soccer twice a week in the fall, took a shot and felt something sharp anterior right groin.   Ok with walking. Pain worse with running. Tried aleve. Pain up to 3/10 when he tries to run, play sports. Old diclofenac helped for this, taking aleve now. No skin changes, numbness. No back pain.  No past medical history on file.  No current outpatient prescriptions on file prior to visit.   No current facility-administered medications on file prior to visit.     Past Surgical History:  Procedure Laterality Date  . broken wrist  1988  . FRACTURE SURGERY  1993   (L) thumb  . TYMPANOSTOMY TUBE PLACEMENT      No Known Allergies  Social History   Social History  . Marital status: Married    Spouse name: N/A  . Number of children: N/A  . Years of education: N/A   Occupational History  . Not on file.   Social History Main Topics  . Smoking status: Former Smoker    Types: Cigarettes    Quit date: 04/03/2012  . Smokeless tobacco: Never Used  . Alcohol use Yes     Comment: 1 beer or wine per week  . Drug use: No  . Sexual activity: Yes   Other Topics Concern  . Not on file   Social History Narrative  . No narrative on file    Family History  Problem Relation Age of Onset  . High blood pressure Mother   . Hypertension Mother   . High blood pressure Father   . Diabetes Father   . Heart disease Father   . Hypertension Father   . Hyperlipidemia Father   . Stroke Father   . Cancer Maternal Aunt     stomach,colon  . Cancer Maternal Uncle     lung,prostate  . High blood  pressure Maternal Grandmother   . Stroke Maternal Grandmother   . High blood pressure Maternal Grandfather   . High blood pressure Paternal Grandmother   . Stroke Paternal Grandmother   . High blood pressure Paternal Grandfather   . Heart attack Neg Hx   . Sudden death Neg Hx     BP 113/71   Pulse 61   Ht 6' 1"  (1.854 m)   Wt 185 lb (83.9 kg)   BMI 24.41 kg/m   Review of Systems: See HPI above.     Objective:  Physical Exam:  Gen: NAD, comfortable in exam room  Right hand: No gross deformity, swelling, bruising. Minimal TTP radial aspect of 1st MCP.  No other tenderness of wrist or hand. FROM 1st MCP, IP joints with 5/5 strength. Collateral ligaments intact on exam. NVI distally.  Left hand: FROM digits without pain.  Right hip: No gross deformity, swelling, bruising. Mild tenderness to palpation just superior to inguinal ligament.  No other tenderness including ASIS, over hip flexor, quad, symphysis, iliac crest, trochanter. FROM with negative logroll, fabers, piriformis stretches. Mild pain lower abdomen with straight leg raise. NVI distally.   Assessment & Plan:  1. Right  thumb injury - independently reviewed radiographs - appears he sustained a small avulsion about where RCL comes in.  Has been improving and no laxity on collateral ligament testing here.  Can consider occupational therapy if doesn't continue to improve.  Icing, tylenol, ibuprofen as needed.  2. Right hip pain - independently reviewed radiographs and no evidence avulsion fracture, other bony abnormalities.  Consistent with sports hernia based on location, tenderness, very small bulge on valsalva.  Start physical therapy, home exercises.  Icing, discussed relative rest.  Ibuprofen if needed.  F/u in 6 weeks.

## 2016-08-12 NOTE — Assessment & Plan Note (Signed)
independently reviewed radiographs - appears he sustained a small avulsion about where RCL comes in.  Has been improving and no laxity on collateral ligament testing here.  Can consider occupational therapy if doesn't continue to improve.  Icing, tylenol, ibuprofen as needed.

## 2016-08-18 DIAGNOSIS — L821 Other seborrheic keratosis: Secondary | ICD-10-CM | POA: Diagnosis not present

## 2016-08-18 DIAGNOSIS — D235 Other benign neoplasm of skin of trunk: Secondary | ICD-10-CM | POA: Diagnosis not present

## 2016-08-18 DIAGNOSIS — D1800 Hemangioma unspecified site: Secondary | ICD-10-CM | POA: Diagnosis not present

## 2016-09-26 NOTE — Addendum Note (Signed)
Addended by: Sherrie George F on: 09/26/2016 12:18 PM   Modules accepted: Orders

## 2016-10-06 ENCOUNTER — Ambulatory Visit: Payer: BLUE CROSS/BLUE SHIELD | Attending: Family Medicine | Admitting: Physical Therapy

## 2016-10-06 DIAGNOSIS — R2689 Other abnormalities of gait and mobility: Secondary | ICD-10-CM | POA: Diagnosis present

## 2016-10-06 DIAGNOSIS — M25551 Pain in right hip: Secondary | ICD-10-CM | POA: Insufficient documentation

## 2016-10-06 NOTE — Therapy (Signed)
[facility] [address]  [place], [place], [address] Phone: [phone]   Fax:  [phone]  Physical Therapy Evaluation  Patient Details  Name: Edwin Ryan MRN: [id number] Date of Birth: 24-Feb-1976 Referring Provider: Dr. Karlton Lemon  Encounter Date: 10/06/2016      PT End of Session - 10/06/16 1810    Visit Number 1   Number of Visits 12   Date for PT Re-Evaluation 11/17/16   PT Start Time 1446   PT Stop Time 1530   PT Time Calculation (min) 44 min   Activity Tolerance Patient tolerated treatment well   Behavior During Therapy [facility] for tasks assessed/performed      No past medical history on file.  Past Surgical History:  Procedure Laterality Date  . broken wrist  1988  . FRACTURE SURGERY  1993   (L) thumb  . TYMPANOSTOMY TUBE PLACEMENT      There were no vitals filed for this visit.       Subjective Assessment - 10/06/16 1446    Subjective Patient reporting long history of soccer playing - took break for 13 years, started to again play this fall (heavily). Currently having R anterior hip pain. Has been stretching. Took some time off - felt better, so started playing again. Currently playingin adult league - only has pain after. Running straight no issue, pain with cutting, shooting ball, take off. Seen by sports MD - patient reporting possible sports hernia. Denies any N&T into leg, as well as bowel and bladder involvement.    Diagnostic tests none   Patient Stated Goals return to soccer pain free   Currently in Pain? No/denies   Multiple Pain Sites No            [facility] PT Assessment - 10/06/16 1452      Assessment   Medical Diagnosis Right hip pain   Referring Provider Dr. Karlton Lemon   Onset Date/Surgical Date --  9 months ago   Next MD Visit prn   Prior Therapy no     Precautions   Precautions None     Restrictions   Weight Bearing Restrictions No     Balance Screen   Has  the patient fallen in the past 6 months No   Has the patient had a decrease in activity level because of a fear of falling?  No   Is the patient reluctant to leave their home because of a fear of falling?  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   Leisure soccer     Cognition   Overall Cognitive Status Within Functional Limits for tasks assessed     Observation/Other Assessments   Focus on Therapeutic Outcomes ([place])  HIP: 69 (31% limited, predicted 24% limited)     Sensation   Light Touch Appears Intact     Coordination   Gross Motor Movements are Fluid and Coordinated Yes     Posture/Postural Control   Posture/Postural Control No significant limitations     ROM / Strength   AROM / PROM / Strength AROM;Strength     AROM   Overall AROM  Within functional limits for tasks performed   Overall AROM Comments B LE  Strength   Overall Strength Within functional limits for tasks performed   Overall Strength Comments B LE grossly 4+ to 5 out of 5 without pain     Flexibility   Soft Tissue Assessment /Muscle Length yes   Hamstrings B tightness   Quadriceps B anterior hip tightness with R>L   Piriformis B tightness     Special Tests    Special Tests Hip Special Tests   Hip Special Tests  Anterior Hip Impingement Test     Anterior Hip Impingement Test    Side  Right   Comments FADDIR - pt reporting "weird" sensation, tightness in anterior hip reported            Objective measurements completed on examination: See above findings.          [facility] Adult PT Treatment/Exercise - 10/06/16 1452      Exercises   Exercises Knee/Hip     Knee/Hip Exercises: Stretches   Passive Hamstring Stretch Right;Left;3 reps;30 seconds   Passive  Hamstring Stretch Limitations supine with strap   Hip Flexor Stretch Right;2 reps;30 seconds   Hip Flexor Stretch Limitations 1/2 kneeling   Other Knee/Hip Stretches R hip adductior stretch - kneeling fencer stretch 2 x 30 seconds     Knee/Hip Exercises: Supine   Bridges 10 reps   Straight Leg Raises Strengthening;Right;15 reps                PT Education - 10/06/16 1809    Education provided Yes   Education Details exam findings, POC, HEP   Person(s) Educated Patient   Methods Explanation;Demonstration;Handout   Comprehension Verbalized understanding;Returned demonstration             PT Long Term Goals - 10/06/16 1817      PT LONG TERM GOAL #1   Title patient to be independent with HEP (11/17/16)   Status New     PT LONG TERM GOAL #2   Title Patient to report/demonstrate ability to shoot soccer ball and laterally cut without increased pain (11/17/16)   Status New     PT LONG TERM GOAL #3   Title Patient to improve R SLR to no visible weakness with good quad control and no pain reproduced at anterior hip (11/17/16)   Status New     PT LONG TERM GOAL #4   Title Patient to report ability to return to sport without pain limiting (11/17/16)   Status New     PT LONG TERM GOAL #5   Title Patient to improve soft tissue quality at R anterior hip as demonstrated by palpation and increased muscle length (11/17/16)   Status New                Plan - 10/06/16 1811    Clinical Impression Statement Patient is a 41 y/o male presenting to [facility] today with primary complaints of R anterior hip pain that is aggravated with lateral cutting, shooting soccer ball as well as prolonged running. Patient today with good AROM and strength, however, moderately tight in B Hamstring and anterior hip musculature. patient with a great deal of tenderness to palpation along anterior hip muscles (rectus, pectineus, sartorius, and some possible adductors) with patient intolerable to deep  palpation. Some weakness noted with SLR. Patient given initial HEP for stretching and gentle strengthening in these areas with good carryover. Patient to beneift form PT to address the above listed deficits to allow for return to sport without pain.    Clinical  Presentation Stable   Clinical Decision Making Low   Rehab Potential Excellent   PT Frequency 2x / week   PT Duration 6 weeks   PT Treatment/Interventions ADLs/Self Care Home Management;Cryotherapy;Electrical Stimulation;Iontophoresis 40m/ml Dexamethasone;Moist Heat;Ultrasound;Neuromuscular re-education;Balance training;Therapeutic exercise;Therapeutic activities;Functional mobility training;Patient/family education;Manual techniques;Vasopneumatic Device;Taping;Dry needling   Consulted and Agree with Plan of Care Patient      Patient will benefit from skilled therapeutic intervention in order to improve the following deficits and impairments:  Decreased activity tolerance, Decreased mobility, Decreased strength, Difficulty walking, Pain  Visit Diagnosis: Pain in right hip - Plan: PT plan of care cert/re-cert  Other abnormalities of gait and mobility - Plan: PT plan of care cert/re-cert     Problem List Patient Active Problem List   Diagnosis Date Noted  . Thumb injury, right, initial encounter 08/12/2016  . Visit for preventive health examination 07/29/2016  . Right hand pain 07/29/2016  . Right hip pain 07/29/2016  . Allergic rhinitis 12/08/2013  . Bilateral foot pain 06/14/2012  . Abnormality of gait 06/14/2012  . Flat feet 06/03/2012     SLanney Gins PT, DPT 10/06/16 6:21 PM   [facility] [address] [place] [address]Phone: [phone]  Fax:  [phone] Name: NCaellum MancilMRN: [id number] of Birth: [birth year]

## 2016-10-06 NOTE — Patient Instructions (Signed)
Hamstring Step 2   Left foot relaxed, knee straight, other leg bent, foot flat. Raise straight leg further upward to maximal range. Hold __30_ seconds. Relax leg completely down. Repeat _3__ times.   Hip Flexor Stretch: Proposal Pose    Maintain pelvic tilt, lift pubic bone toward navel. Engage posterior hip muscles (firm glute muscles of leg in back position). To increase stretch, maintain balance and ease hips forward. Hold for __30__ breaths. Repeat __3_ times each leg.   Straight Leg Raise   Tighten stomach and slowly raise locked right leg _6___ inches from floor. Repeat __15_ times per set. Do __2__ sets per session.    Bridging   Slowly raise buttocks from floor, keeping stomach tight. Repeat _15___ times per set. Do __2__ sets per session.    Side Warrior, Kneeling    Pelvic tilt engaged, press front knee open to line up with toes. Hold for __30__ breaths. Repeat _3___ times each leg.   Hip Extension   Lie on back, legs in air, knees bent. Grasp hands behind one thigh and cross other leg over same thigh. Hold __30__ seconds. Repeat with other leg held. Repeat __3__ times.

## 2016-10-13 ENCOUNTER — Ambulatory Visit: Payer: BLUE CROSS/BLUE SHIELD

## 2016-10-13 DIAGNOSIS — M25551 Pain in right hip: Secondary | ICD-10-CM

## 2016-10-13 DIAGNOSIS — R2689 Other abnormalities of gait and mobility: Secondary | ICD-10-CM

## 2016-10-13 NOTE — Therapy (Addendum)
[facility] [address]  [place], [place], [address] Phone: [phone]   Fax:  [phone]  Physical Therapy Treatment  Patient Details  Name: Edwin Ryan MRN: [id number] Date of Birth: 08/05/1975 Referring Provider: Dr. Karlton Ryan  Encounter Date: 10/13/2016      PT End of Session - 10/13/16 1628    Visit Number 2   Number of Visits 12   Date for PT Re-Evaluation 11/17/16   PT Start Time 1623  pt. arrived late    PT Stop Time 1710   PT Time Calculation (min) 47 min   Activity Tolerance Patient tolerated treatment well   Behavior During Therapy [facility] for tasks assessed/performed      No past medical history on file.  Past Surgical History:  Procedure Laterality Date  . broken wrist  1988  . FRACTURE SURGERY  1993   (L) thumb  . TYMPANOSTOMY TUBE PLACEMENT      There were no vitals filed for this visit.      Subjective Assessment - 10/13/16 1626    Subjective Pt. noting he is performing HEP stretches without issue.     Patient Stated Goals return to soccer pain free   Currently in Pain? No/denies   Multiple Pain Sites No                         [facility] Adult PT Treatment/Exercise - 10/13/16 1650      Knee/Hip Exercises: Stretches   Passive Hamstring Stretch Right;Left;3 reps;30 seconds   Passive Hamstring Stretch Limitations supine with strap   Quad Stretch Right;60 seconds;1 rep   Quad Stretch Limitations prone with strap; bolster under thigh   Hip Flexor Stretch Right;2 reps;30 seconds   Hip Flexor Stretch Limitations 1/2 kneeling   Other Knee/Hip Stretches R hip adductior stretch - kneeling fencer stretch 2 x 30 seconds   Other Knee/Hip Stretches Supine B groin stretch x 30 sec      Knee/Hip Exercises: Aerobic   Nustep Lvl 6 , 6 min      Knee/Hip Exercises: Supine   Bridges 15 reps   Bridges with [employer] Both;1 set;10 reps  with adduction ball squeeze; 3"  hold    Straight Leg Raises Strengthening;Right;15 reps;2 sets   Straight Leg Raises Limitations 2nd set with 2#    Other Supine Knee/Hip Exercises Supine R LE hip flexion from ground to table x 15 reps      Manual Therapy   Manual Therapy Soft tissue mobilization;Myofascial release   Manual therapy comments in modified thomas position   Soft tissue mobilization STM to R hip flexor area   Myofascial Release TPR to R hip flexor/pectineus area                PT Education - 10/13/16 1730    Education provided Yes   Education Details bridge with adduction ball squeeze    Person(s) Educated Patient   Methods Explanation;Demonstration;Verbal cues;Handout   Comprehension Verbalized understanding;Returned demonstration;Verbal cues required;Need further instruction             PT Long Term Goals - 10/13/16 1629      PT LONG TERM GOAL #1   Title patient to be independent with HEP (11/17/16)   Status On-going     PT LONG TERM GOAL #2   Title Patient to report/demonstrate ability to shoot soccer ball and laterally cut without increased pain (11/17/16)  Status On-going     PT LONG TERM GOAL #3   Title Patient to improve R SLR to no visible weakness with good quad control and no pain reproduced at anterior hip (11/17/16)   Status On-going     PT LONG TERM GOAL #4   Title Patient to report ability to return to sport without pain limiting (11/17/16)   Status On-going     PT LONG TERM GOAL #5   Title Patient to improve soft tissue quality at R anterior hip as demonstrated by palpation and increased muscle length (11/17/16)   Status On-going               Plan - 10/13/16 1630    Clinical Impression Statement Pt. doing well today noting he has been performing all HEP exercises and stretches without issue consistently.  HEP reviewed with pt. demonstrating good recall and technique with this.  Treatment with R proximal LE musculature stretching and focusing on gentle STM/TPR  to hip flexor/groin.  Pt. tolerated all activities in treatment well and remains tender to deep palpation in R hip flexor/ groin area.  PT discussed possibility of DN with pt. today and educational handout issued to pt.  Pt. verbalizing he is open to idea of DN in future visits.  Pt. unable to make next week appointment and currently scheduled to attend therapy ~ 2 wks from now.     PT Treatment/Interventions ADLs/Self Care Home Management;Cryotherapy;Electrical Stimulation;Iontophoresis 61m/ml Dexamethasone;Moist Heat;Ultrasound;Neuromuscular re-education;Balance training;Therapeutic exercise;Therapeutic activities;Functional mobility training;Patient/family education;Manual techniques;Vasopneumatic Device;Taping;Dry needling   PT Next Visit Plan possible DN; tolerance to updated HEP; advancement/update of HEP; gentle strengthening/stretching       Patient will benefit from skilled therapeutic intervention in order to improve the following deficits and impairments:  Decreased activity tolerance, Decreased mobility, Decreased strength, Difficulty walking, Pain  Visit Diagnosis: Pain in right hip  Other abnormalities of gait and mobility     Problem List Patient Active Problem List   Diagnosis Date Noted  . Thumb injury, right, initial encounter 08/12/2016  . Visit for preventive health examination 07/29/2016  . Right hand pain 07/29/2016  . Right hip pain 07/29/2016  . Allergic rhinitis 12/08/2013  . Bilateral foot pain 06/14/2012  . Abnormality of gait 06/14/2012  . Flat feet 06/03/2012    MBess Harvest PTA 10/13/16 5:34 PM   PHYSICAL THERAPY DISCHARGE SUMMARY  Visits from Start of Care: 2  Current functional level related to goals / functional outcomes: See above   Remaining deficits: See above   Education / Equipment: HEP  Plan: Patient agrees to discharge.  Patient goals were not met. Patient is being discharged due to not returning since the last visit.  ?????      SLanney Gins PT, DPT 11/24/16 9:49 AM  [facility] [place] [address]Phone: [phone]  Fax:  [phone] Name: NCuyler VandykenMRN: [id number] of Birth: 602/08/1975

## 2016-10-20 ENCOUNTER — Ambulatory Visit: Payer: BLUE CROSS/BLUE SHIELD

## 2016-10-27 ENCOUNTER — Ambulatory Visit: Payer: BLUE CROSS/BLUE SHIELD | Admitting: Physical Therapy

## 2016-10-27 DIAGNOSIS — M9904 Segmental and somatic dysfunction of sacral region: Secondary | ICD-10-CM | POA: Diagnosis not present

## 2016-10-27 DIAGNOSIS — M9903 Segmental and somatic dysfunction of lumbar region: Secondary | ICD-10-CM | POA: Diagnosis not present

## 2016-10-27 DIAGNOSIS — M9905 Segmental and somatic dysfunction of pelvic region: Secondary | ICD-10-CM | POA: Diagnosis not present

## 2016-10-27 DIAGNOSIS — M5136 Other intervertebral disc degeneration, lumbar region: Secondary | ICD-10-CM | POA: Diagnosis not present

## 2016-10-29 DIAGNOSIS — M9904 Segmental and somatic dysfunction of sacral region: Secondary | ICD-10-CM | POA: Diagnosis not present

## 2016-10-29 DIAGNOSIS — M9905 Segmental and somatic dysfunction of pelvic region: Secondary | ICD-10-CM | POA: Diagnosis not present

## 2016-10-29 DIAGNOSIS — M5136 Other intervertebral disc degeneration, lumbar region: Secondary | ICD-10-CM | POA: Diagnosis not present

## 2016-10-29 DIAGNOSIS — M9903 Segmental and somatic dysfunction of lumbar region: Secondary | ICD-10-CM | POA: Diagnosis not present

## 2016-11-07 DIAGNOSIS — M9903 Segmental and somatic dysfunction of lumbar region: Secondary | ICD-10-CM | POA: Diagnosis not present

## 2016-11-07 DIAGNOSIS — M9905 Segmental and somatic dysfunction of pelvic region: Secondary | ICD-10-CM | POA: Diagnosis not present

## 2016-11-07 DIAGNOSIS — M5136 Other intervertebral disc degeneration, lumbar region: Secondary | ICD-10-CM | POA: Diagnosis not present

## 2016-11-07 DIAGNOSIS — M9904 Segmental and somatic dysfunction of sacral region: Secondary | ICD-10-CM | POA: Diagnosis not present

## 2017-03-05 DIAGNOSIS — M542 Cervicalgia: Secondary | ICD-10-CM | POA: Diagnosis not present

## 2017-03-05 DIAGNOSIS — M62838 Other muscle spasm: Secondary | ICD-10-CM | POA: Diagnosis not present

## 2017-03-05 DIAGNOSIS — M9902 Segmental and somatic dysfunction of thoracic region: Secondary | ICD-10-CM | POA: Diagnosis not present

## 2017-03-05 DIAGNOSIS — M9901 Segmental and somatic dysfunction of cervical region: Secondary | ICD-10-CM | POA: Diagnosis not present

## 2017-05-12 DIAGNOSIS — M9902 Segmental and somatic dysfunction of thoracic region: Secondary | ICD-10-CM | POA: Diagnosis not present

## 2017-05-12 DIAGNOSIS — M9901 Segmental and somatic dysfunction of cervical region: Secondary | ICD-10-CM | POA: Diagnosis not present

## 2017-05-12 DIAGNOSIS — M5032 Other cervical disc degeneration, mid-cervical region, unspecified level: Secondary | ICD-10-CM | POA: Diagnosis not present

## 2017-05-12 DIAGNOSIS — M47812 Spondylosis without myelopathy or radiculopathy, cervical region: Secondary | ICD-10-CM | POA: Diagnosis not present

## 2017-06-12 ENCOUNTER — Other Ambulatory Visit: Payer: Self-pay | Admitting: Physician Assistant

## 2017-06-12 MED ORDER — OSELTAMIVIR PHOSPHATE 75 MG PO CAPS: 75.0000 mg | ORAL_CAPSULE | Freq: Two times a day (BID) | ORAL | 0 refills | Status: DC

## 2017-09-08 DIAGNOSIS — L814 Other melanin hyperpigmentation: Secondary | ICD-10-CM | POA: Diagnosis not present

## 2017-09-08 DIAGNOSIS — L821 Other seborrheic keratosis: Secondary | ICD-10-CM | POA: Diagnosis not present

## 2017-09-08 DIAGNOSIS — D1801 Hemangioma of skin and subcutaneous tissue: Secondary | ICD-10-CM | POA: Diagnosis not present

## 2018-02-08 ENCOUNTER — Ambulatory Visit: Payer: BLUE CROSS/BLUE SHIELD | Admitting: Family Medicine

## 2018-02-08 ENCOUNTER — Encounter: Payer: Self-pay | Admitting: Family Medicine

## 2018-02-08 VITALS — BP 133/79 | HR 73 | Ht 73.0 in | Wt 185.0 lb

## 2018-02-08 DIAGNOSIS — M79672 Pain in left foot: Secondary | ICD-10-CM

## 2018-02-08 NOTE — Patient Instructions (Signed)
You have a heel contusion. Icing 15 minutes at a time 3-4 times a day. Aleve 1-2 tabs twice a day OR ibuprofen 668m three times a day with food only as needed. Sports/activities as tolerated. Wear the heel cushion in your cleats. If you want new custom orthotics for your cleats let sierra know and she'll block time out to do this. Otherwise follow up as needed for your heel.

## 2018-02-08 NOTE — Progress Notes (Signed)
PCP: Brunetta Jeans, PA-C  Subjective:   HPI: Patient is a 42 y.o. male here for 42 year old male with medial left heel pain x1 day.  Patient reports 6/10 pain after playing soccer.  He believes his injury occurred after jumping several times landing on his heel while wearing soccer cleats.  He denies any initial pain that prevented him from finishing the game.  He really noticed the pain later that evening.  He does have some pain while ambulating in regular casual shoes today.  Reports a very slight area of swelling and bruising.  No numbness or tingling  Additionally, patient is interested in having new orthotics made.  He had a pair previously made about 3 years ago.  These have been helpful with his midfoot pain.  No past medical history on file.  Current Outpatient Medications on File Prior to Visit  Medication Sig Dispense Refill  . oseltamivir (TAMIFLU) 75 MG capsule Take 1 capsule (75 mg total) by mouth 2 (two) times daily. 10 capsule 0   No current facility-administered medications on file prior to visit.     Past Surgical History:  Procedure Laterality Date  . broken wrist  1988  . FRACTURE SURGERY  1993   (L) thumb  . TYMPANOSTOMY TUBE PLACEMENT      No Known Allergies  Social History   Socioeconomic History  . Marital status: Married    Spouse name: Not on file  . Number of children: Not on file  . Years of education: Not on file  . Highest education level: Not on file  Occupational History  . Not on file  Social Needs  . Financial resource strain: Not on file  . Food insecurity:    Worry: Not on file    Inability: Not on file  . Transportation needs:    Medical: Not on file    Non-medical: Not on file  Tobacco Use  . Smoking status: Former Smoker    Types: Cigarettes    Last attempt to quit: 04/03/2012    Years since quitting: 5.8  . Smokeless tobacco: Never Used  Substance and Sexual Activity  . Alcohol use: Yes    Comment: 1 beer or wine per  week  . Drug use: No  . Sexual activity: Yes  Lifestyle  . Physical activity:    Days per week: Not on file    Minutes per session: Not on file  . Stress: Not on file  Relationships  . Social connections:    Talks on phone: Not on file    Gets together: Not on file    Attends religious service: Not on file    Active member of club or organization: Not on file    Attends meetings of clubs or organizations: Not on file    Relationship status: Not on file  . Intimate partner violence:    Fear of current or ex partner: Not on file    Emotionally abused: Not on file    Physically abused: Not on file    Forced sexual activity: Not on file  Other Topics Concern  . Not on file  Social History Narrative  . Not on file    Family History  Problem Relation Age of Onset  . High blood pressure Mother   . Hypertension Mother   . High blood pressure Father   . Diabetes Father   . Heart disease Father   . Hypertension Father   . Hyperlipidemia Father   . Stroke  Father   . Cancer Maternal Aunt        stomach,colon  . Cancer Maternal Uncle        lung,prostate  . High blood pressure Maternal Grandmother   . Stroke Maternal Grandmother   . High blood pressure Maternal Grandfather   . High blood pressure Paternal Grandmother   . Stroke Paternal Grandmother   . High blood pressure Paternal Grandfather   . Heart attack Neg Hx   . Sudden death Neg Hx     BP 133/79   Pulse 73   Ht 6' 1"  (1.854 m)   Wt 185 lb (83.9 kg)   BMI 24.41 kg/m   Review of Systems: See HPI above.     Objective:  Physical Exam:  Gen: awake, alert, NAD, comfortable in exam room Pulm: breathing unlabored  Left foot: Inspection:  No obvious bony deformity.  Small area of localized swelling over the medial aspect of the calcaneus.  Slight bruising as well in this area.  Palpation: Mild focal tenderness over the medial calcaneus.  No tenderness over the plantar aspect of the foot.  No tenderness over the  lateral or posterior calcaneus ROM: Full  ROM of the ankle. Strength: 5/5 strength ankle in all planes Neurovascular: N/V intact distally in the lower extremity Special tests: Negative anterior drawer. Negative squeeze.  Right foot: No obvious deformity or swelling No tenderness palpation Full range of motion 5/5 strength N/V intact    Assessment & Plan:  1.  Left heel pain- this is secondary to soft tissue bruising from the hard sole of his cleats.  There is no evidence of or concern for calcaneal fracture.  Recommend wearing soft insoles for padding of the heel.  Ice.  NSAIDs as needed.  Follow-up as needed  2.  Today we also modified his insoles that he had previously made.  The first ray post were removed and replaced due to wear and tear.  He will consider returning to have new custom orthotics created for his cleats with dress orthotic.

## 2018-02-09 ENCOUNTER — Encounter: Payer: Self-pay | Admitting: Family Medicine

## 2018-03-23 ENCOUNTER — Ambulatory Visit (INDEPENDENT_AMBULATORY_CARE_PROVIDER_SITE_OTHER): Payer: BLUE CROSS/BLUE SHIELD | Admitting: Physician Assistant

## 2018-03-23 ENCOUNTER — Encounter: Payer: Self-pay | Admitting: Physician Assistant

## 2018-03-23 ENCOUNTER — Other Ambulatory Visit: Payer: Self-pay

## 2018-03-23 VITALS — BP 100/70 | HR 53 | Temp 98.3°F | Resp 14 | Ht 73.0 in | Wt 190.0 lb

## 2018-03-23 DIAGNOSIS — Z0000 Encounter for general adult medical examination without abnormal findings: Secondary | ICD-10-CM

## 2018-03-23 LAB — COMPREHENSIVE METABOLIC PANEL
ALT: 34 U/L (ref 0–53)
AST: 21 U/L (ref 0–37)
Albumin: 4.5 g/dL (ref 3.5–5.2)
Alkaline Phosphatase: 55 U/L (ref 39–117)
BUN: 18 mg/dL (ref 6–23)
CALCIUM: 9.5 mg/dL (ref 8.4–10.5)
CHLORIDE: 103 meq/L (ref 96–112)
CO2: 30 mEq/L (ref 19–32)
CREATININE: 0.98 mg/dL (ref 0.40–1.50)
GFR: 88.53 mL/min (ref >60.00)
GLUCOSE: 98 mg/dL (ref 70–99)
Potassium: 4.2 mEq/L (ref 3.5–5.1)
SODIUM: 139 meq/L (ref 135–145)
Total Bilirubin: 0.8 mg/dL (ref 0.2–1.2)
Total Protein: 7 g/dL (ref 6.0–8.3)

## 2018-03-23 LAB — CBC WITH DIFFERENTIAL/PLATELET
BASOS PCT: 0.7 % (ref 0.0–3.0)
Basophils Absolute: 0 10*3/uL (ref 0.0–0.1)
Eosinophils Absolute: 0 10*3/uL (ref 0.0–0.7)
Eosinophils Relative: 1 % (ref 0.0–5.0)
HEMATOCRIT: 42.5 % (ref 39.0–52.0)
Hemoglobin: 14.5 g/dL (ref 13.0–17.0)
LYMPHS ABS: 1.8 10*3/uL (ref 0.7–4.0)
LYMPHS PCT: 41.3 % (ref 12.0–46.0)
MCHC: 34.2 g/dL (ref 30.0–36.0)
MCV: 89.7 fl (ref 78.0–100.0)
MONOS PCT: 9.5 % (ref 3.0–12.0)
Monocytes Absolute: 0.4 10*3/uL (ref 0.1–1.0)
NEUTROS PCT: 47.5 % (ref 43.0–77.0)
Neutro Abs: 2.1 10*3/uL (ref 1.4–7.7)
PLATELETS: 164 10*3/uL (ref 150.0–400.0)
RBC: 4.74 Mil/uL (ref 4.22–5.81)
RDW: 13.3 % (ref 11.5–15.5)
WBC: 4.4 10*3/uL (ref 4.0–10.5)

## 2018-03-23 LAB — LIPID PANEL
Cholesterol: 180 mg/dL (ref 0–200)
HDL: 43.7 mg/dL (ref >39.00)
LDL CALC: 116 mg/dL — AB (ref 0–99)
NonHDL: 136.32
TRIGLYCERIDES: 100 mg/dL (ref 0.0–149.0)
Total CHOL/HDL Ratio: 4
VLDL: 20 mg/dL (ref 0.0–40.0)

## 2018-03-23 LAB — HEMOGLOBIN A1C: Hgb A1c MFr Bld: 5.3 % (ref 4.6–6.5)

## 2018-03-23 MED ORDER — CELECOXIB 100 MG PO CAPS: 100.0000 mg | ORAL_CAPSULE | Freq: Two times a day (BID) | ORAL | 0 refills | Status: DC | PRN

## 2018-03-23 NOTE — Patient Instructions (Signed)
-Please go to the lab for blood work.  -Our office will call you with your results unless you have chosen to receive results via MyChart. -If your blood work is normal we will follow-up each year for physicals and as scheduled for chronic medical problems. -If anything is abnormal we will treat accordingly and get you in for a follow-up.   Preventive Care 18-39 Years, Male Preventive care refers to lifestyle choices and visits with your health care provider that can promote health and wellness. What does preventive care include?  A yearly physical exam. This is also called an annual well check.  Dental exams once or twice a year.  Routine eye exams. Ask your health care provider how often you should have your eyes checked.  Personal lifestyle choices, including: ? Daily care of your teeth and gums. ? Regular physical activity. ? Eating a healthy diet. ? Avoiding tobacco and drug use. ? Limiting alcohol use. ? Practicing safe sex. What happens during an annual well check? The services and screenings done by your health care provider during your annual well check will depend on your age, overall health, lifestyle risk factors, and family history of disease. Counseling Your health care provider may ask you questions about your:  Alcohol use.  Tobacco use.  Drug use.  Emotional well-being.  Home and relationship well-being.  Sexual activity.  Eating habits.  Work and work environment.  Screening You may have the following tests or measurements:  Height, weight, and BMI.  Blood pressure.  Lipid and cholesterol levels. These may be checked every 5 years starting at age 20.  Diabetes screening. This is done by checking your blood sugar (glucose) after you have not eaten for a while (fasting).  Skin check.  Hepatitis C blood test.  Hepatitis B blood test.  Sexually transmitted disease (STD) testing.  Discuss your test results, treatment options, and if  necessary, the need for more tests with your health care provider. Vaccines Your health care provider may recommend certain vaccines, such as:  Influenza vaccine. This is recommended every year.  Tetanus, diphtheria, and acellular pertussis (Tdap, Td) vaccine. You may need a Td booster every 10 years.  Varicella vaccine. You may need this if you have not been vaccinated.  HPV vaccine. If you are 26 or younger, you may need three doses over 6 months.  Measles, mumps, and rubella (MMR) vaccine. You may need at least one dose of MMR.You may also need a second dose.  Pneumococcal 13-valent conjugate (PCV13) vaccine. You may need this if you have certain conditions and have not been vaccinated.  Pneumococcal polysaccharide (PPSV23) vaccine. You may need one or two doses if you smoke cigarettes or if you have certain conditions.  Meningococcal vaccine. One dose is recommended if you are age 19-21 years and a first-year college student living in a residence hall, or if you have one of several medical conditions. You may also need additional booster doses.  Hepatitis A vaccine. You may need this if you have certain conditions or if you travel or work in places where you may be exposed to hepatitis A.  Hepatitis B vaccine. You may need this if you have certain conditions or if you travel or work in places where you may be exposed to hepatitis B.  Haemophilus influenzae type b (Hib) vaccine. You may need this if you have certain risk factors.  Talk to your health care provider about which screenings and vaccines you need and how often you   need them. This information is not intended to replace advice given to you by your health care provider. Make sure you discuss any questions you have with your health care provider. Document Released: 06/17/2001 Document Revised: 01/09/2016 Document Reviewed: 02/20/2015 Elsevier Interactive Patient Education  2018 [employer]vier Inc. .       

## 2018-03-23 NOTE — Assessment & Plan Note (Signed)
Depression screen negative. Health Maintenance reviewed. Declines flu shot. Preventive schedule discussed and handout given in AVS. Will obtain fasting labs today.

## 2018-03-23 NOTE — Progress Notes (Signed)
Patient presents to clinic today for annual exam.  Patient is fasting for labs.  Diet is well-balanced. 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 Gym 2 x week - resistance and cardio. Body mass index is 25.07 kg/m.  Acute Concerns: No concerns today.  Health Maintenance: Immunizations -- up-to-date.  History reviewed. No pertinent past medical history.  Past Surgical History:  Procedure Laterality Date  . broken wrist  1988  . FRACTURE SURGERY  1993   (L) thumb  . TYMPANOSTOMY TUBE PLACEMENT      No current outpatient medications on file prior to visit.   No current facility-administered medications on file prior to visit.     No Known Allergies  Family History  Problem Relation Age of Onset  . High blood pressure Mother   . Hypertension Mother   . High blood pressure Father   . Diabetes Father   . Heart disease Father   . Hypertension Father   . Hyperlipidemia Father   . Stroke Father   . Cancer Maternal Aunt        stomach,colon  . Cancer Maternal Uncle        lung,prostate  . High blood pressure Maternal Grandmother   . Stroke Maternal Grandmother   . High blood pressure Maternal Grandfather   . High blood pressure Paternal Grandmother   . Stroke Paternal Grandmother   . High blood pressure Paternal Grandfather   . Heart attack Neg Hx   . Sudden death Neg Hx     Social History   Socioeconomic History  . Marital status: Married    Spouse name: Not on file  . Number of children: Not on file  . Years of education: Not on file  . Highest education level: Not on file  Occupational History  . Not on file  Social Needs  . Financial resource strain: Not on file  . Food insecurity:    Worry: Not on file    Inability: Not on file  . Transportation needs:    Medical: Not on file    Non-medical: Not on file  Tobacco Use  . Smoking status: Former Smoker    Types: Cigarettes    Last attempt to quit: 04/03/2012    Years since  quitting: 5.9  . Smokeless tobacco: Never Used  Substance and Sexual Activity  . Alcohol use: Yes    Comment: 1 beer or wine per week  . Drug use: No  . Sexual activity: Yes  Lifestyle  . Physical activity:    Days per week: Not on file    Minutes per session: Not on file  . Stress: Not on file  Relationships  . Social connections:    Talks on phone: Not on file    Gets together: Not on file    Attends religious service: Not on file    Active member of club or organization: Not on file    Attends meetings of clubs or organizations: Not on file    Relationship status: Not on file  . Intimate partner violence:    Fear of current or ex partner: Not on file    Emotionally abused: Not on file    Physically abused: Not on file    Forced sexual activity: Not on file  Other Topics Concern  . Not on file  Social History Narrative  . Not on file   Review of Systems  Constitutional: Negative for fever and weight loss.  HENT:  Negative for ear discharge, ear pain, hearing loss and tinnitus.   Eyes: Negative for blurred vision, double vision, photophobia and pain.  Respiratory: Negative for cough and shortness of breath.   Cardiovascular: Negative for chest pain and palpitations.  Gastrointestinal: Negative for abdominal pain, blood in stool, constipation, diarrhea, heartburn, melena, nausea and vomiting.  Genitourinary: Negative for dysuria, flank pain, frequency, hematuria and urgency.       Nocturia x 0.   Musculoskeletal: Negative for falls.  Neurological: Negative for dizziness, loss of consciousness and headaches.  Endo/Heme/Allergies: Negative for environmental allergies.  Psychiatric/Behavioral: Negative for depression, hallucinations, substance abuse and suicidal ideas. The patient is not nervous/anxious and does not have insomnia.    BP 100/70   Pulse (!) 53   Temp 98.3 F (36.8 C) (Oral)   Resp 14   Ht 6' 1"  (1.854 m)   Wt 190 lb (86.2 kg)   SpO2 99%   BMI 25.07 kg/m    Physical Exam  Constitutional: He is oriented to person, place, and time. He appears well-developed and well-nourished. No distress.  HENT:  Head: Normocephalic and atraumatic.  Right Ear: Tympanic membrane, external ear and ear canal normal.  Left Ear: Tympanic membrane, external ear and ear canal normal.  Nose: Nose normal.  Mouth/Throat: Oropharynx is clear and moist and mucous membranes are normal. No posterior oropharyngeal edema or posterior oropharyngeal erythema.  Eyes: Pupils are equal, round, and reactive to light. Conjunctivae are normal.  Neck: Neck supple. No thyromegaly present.  Cardiovascular: Normal rate, regular rhythm, normal heart sounds and intact distal pulses.  Pulmonary/Chest: Effort normal and breath sounds normal. No respiratory distress. He has no wheezes. He has no rales. He exhibits no tenderness.  Abdominal: Soft. Bowel sounds are normal. He exhibits no distension and no mass. There is no tenderness. There is no rebound and no guarding.  Lymphadenopathy:    He has no cervical adenopathy.  Neurological: He is alert and oriented to person, place, and time. No cranial nerve deficit.  Skin: Skin is warm and dry. No rash noted. He is not diaphoretic.  Psychiatric: He has a normal mood and affect.  Vitals reviewed.  Assessment/Plan: Visit for preventive health examination Depression screen negative. Health Maintenance reviewed. Declines flu shot. Preventive schedule discussed and handout given in AVS. Will obtain fasting labs today.     Leeanne Rio, PA-C

## 2018-05-11 ENCOUNTER — Encounter: Payer: Self-pay | Admitting: Physician Assistant

## 2018-05-11 DIAGNOSIS — Z3009 Encounter for other general counseling and advice on contraception: Secondary | ICD-10-CM

## 2018-05-27 IMAGING — DX DG HIP (WITH OR WITHOUT PELVIS) 2-3V*R*
3 series · 3 of 3 positions shown · non-contrast
Comparison: None.

CLINICAL DATA: Hip pain after playing soccer

EXAM:
DG HIP (WITH OR WITHOUT PELVIS) 2-3V RIGHT

[pelvis ap]
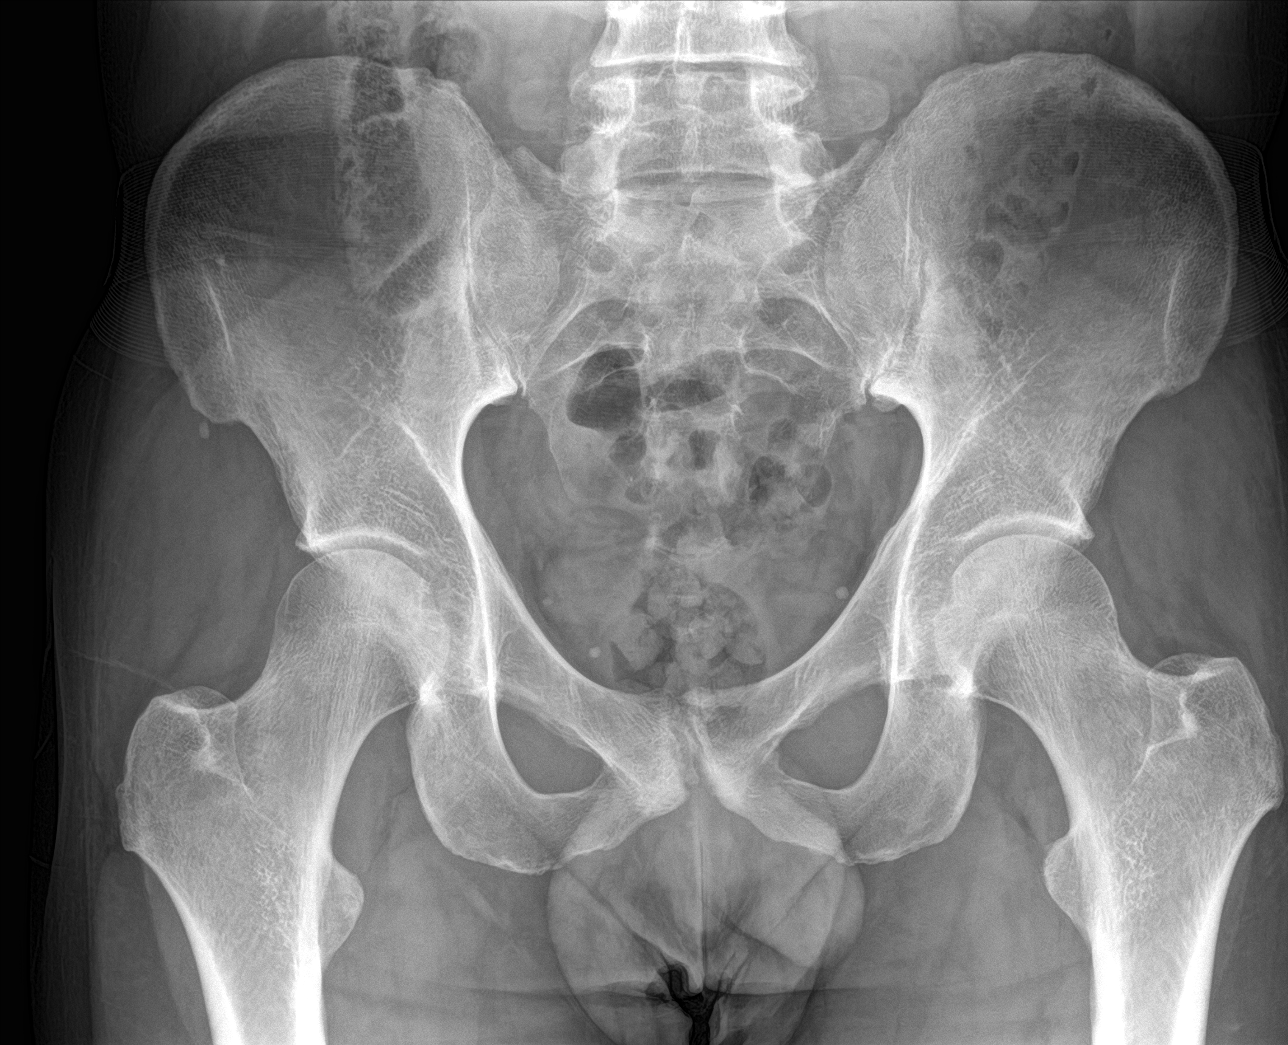

[hip ap]
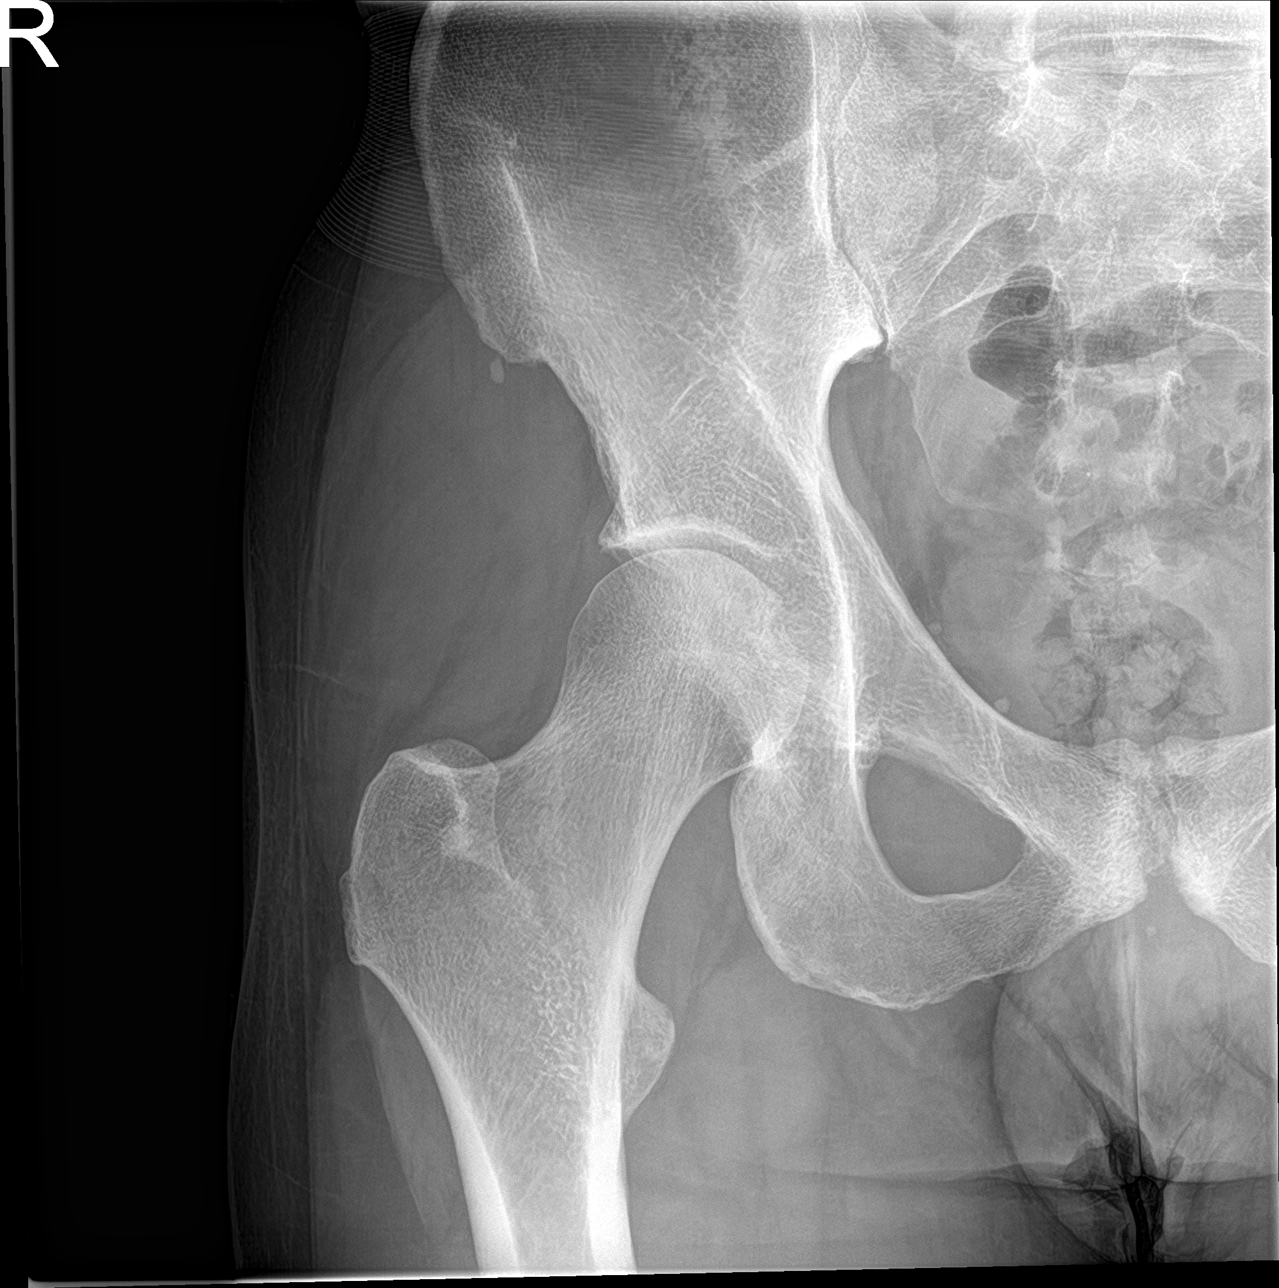

[hip lat]
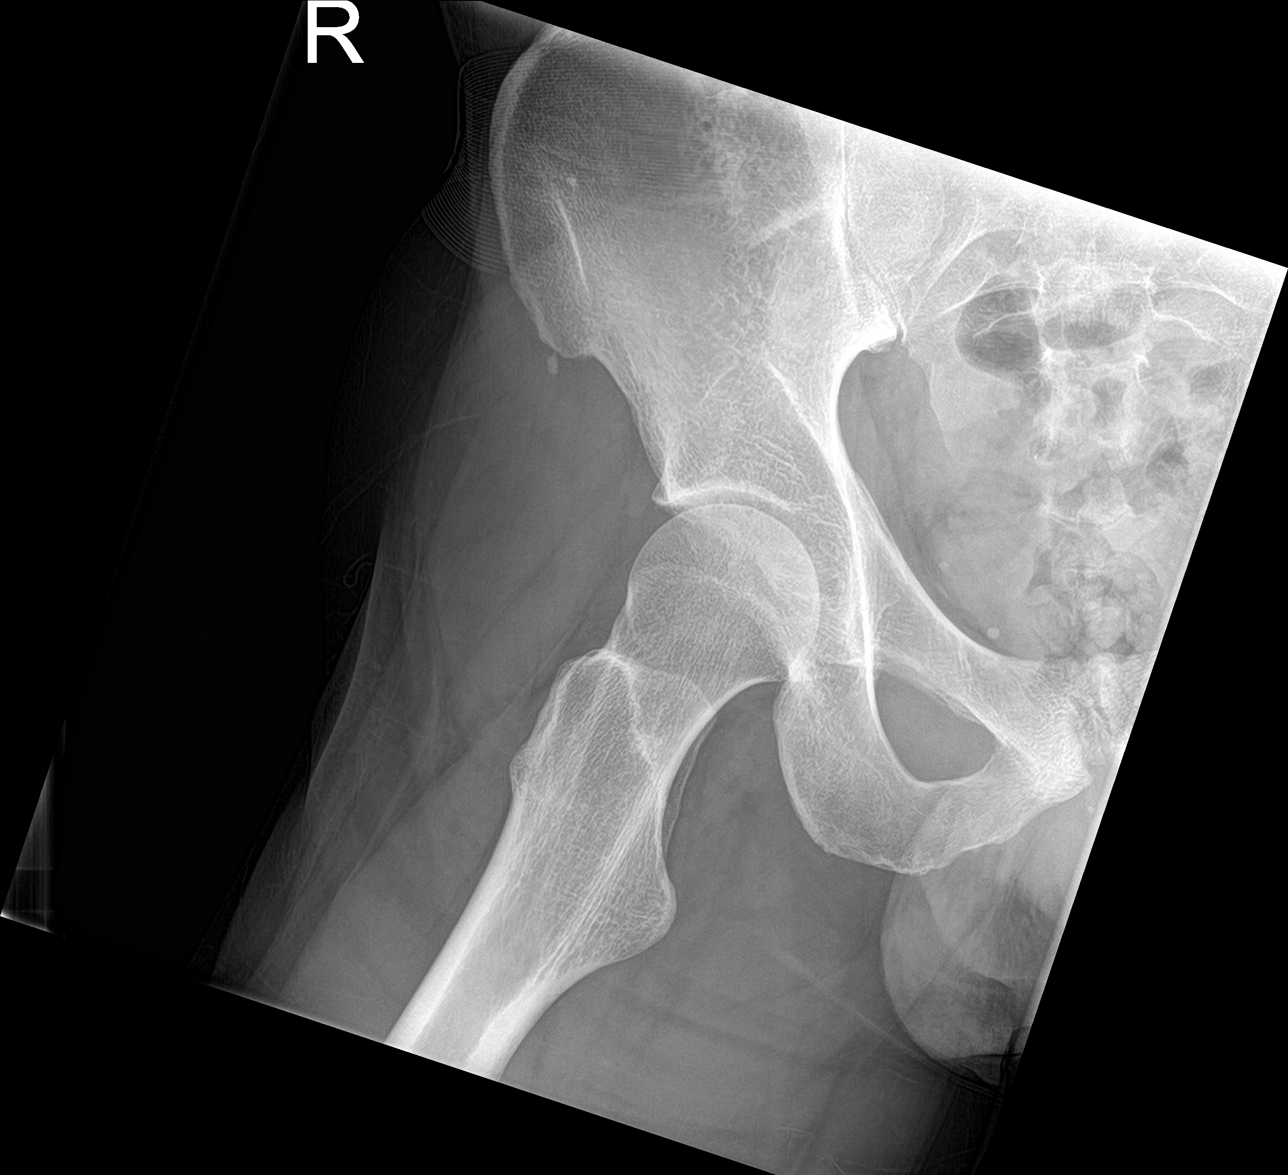

[3 of 3 positions shown; findings below may reference images not displayed]

FINDINGS: Mild inferior SI joint degenerative change. Calcified pelvic
phleboliths. Pubic symphysis appears intact.

No fracture or dislocation.  Joint spaces appear grossly preserved.
IMPRESSION: 1. No acute osseous abnormality
2. Mild SI joint degenerative changes.

## 2018-06-14 DIAGNOSIS — M9905 Segmental and somatic dysfunction of pelvic region: Secondary | ICD-10-CM | POA: Diagnosis not present

## 2018-06-14 DIAGNOSIS — M9901 Segmental and somatic dysfunction of cervical region: Secondary | ICD-10-CM | POA: Diagnosis not present

## 2018-06-14 DIAGNOSIS — M5136 Other intervertebral disc degeneration, lumbar region: Secondary | ICD-10-CM | POA: Diagnosis not present

## 2018-06-14 DIAGNOSIS — M9903 Segmental and somatic dysfunction of lumbar region: Secondary | ICD-10-CM | POA: Diagnosis not present

## 2018-06-30 ENCOUNTER — Encounter: Payer: BLUE CROSS/BLUE SHIELD | Admitting: Family Medicine

## 2018-07-14 ENCOUNTER — Other Ambulatory Visit: Payer: Self-pay

## 2018-07-14 ENCOUNTER — Encounter: Payer: Self-pay | Admitting: Family Medicine

## 2018-07-14 ENCOUNTER — Ambulatory Visit (INDEPENDENT_AMBULATORY_CARE_PROVIDER_SITE_OTHER): Payer: BLUE CROSS/BLUE SHIELD | Admitting: Family Medicine

## 2018-07-14 VITALS — BP 117/73 | HR 76 | Ht 73.0 in | Wt 195.0 lb

## 2018-07-14 DIAGNOSIS — M79672 Pain in left foot: Secondary | ICD-10-CM | POA: Diagnosis not present

## 2018-07-14 DIAGNOSIS — R269 Unspecified abnormalities of gait and mobility: Secondary | ICD-10-CM

## 2018-07-15 ENCOUNTER — Encounter: Payer: Self-pay | Admitting: Family Medicine

## 2018-07-15 NOTE — Progress Notes (Signed)
PCP: Brunetta Jeans, PA-C  Subjective:   HPI: Patient is a 43 y.o. male here for new orthotics.  Patient has pair of orthotics made from 2014. He is a helped him quite a bit with his prior history of multiple feet and ankle injuries and sprains. He still plays soccer and coaches soccer. Most recently had pain in his left heel that has improved since October 2019. He reports he is currently doing well without any pain or other concerns.  History reviewed. No pertinent past medical history.  Current Outpatient Medications on File Prior to Visit  Medication Sig Dispense Refill  . celecoxib (CELEBREX) 100 MG capsule Take 1 capsule (100 mg total) by mouth 2 (two) times daily as needed. 30 capsule 0   No current facility-administered medications on file prior to visit.     Past Surgical History:  Procedure Laterality Date  . broken wrist  1988  . FRACTURE SURGERY  1993   (L) thumb  . TYMPANOSTOMY TUBE PLACEMENT      No Known Allergies  Social History   Socioeconomic History  . Marital status: Married    Spouse name: Not on file  . Number of children: Not on file  . Years of education: Not on file  . Highest education level: Not on file  Occupational History  . Not on file  Social Needs  . Financial resource strain: Not on file  . Food insecurity:    Worry: Not on file    Inability: Not on file  . Transportation needs:    Medical: Not on file    Non-medical: Not on file  Tobacco Use  . Smoking status: Former Smoker    Types: Cigarettes    Last attempt to quit: 04/03/2012    Years since quitting: 6.2  . Smokeless tobacco: Never Used  Substance and Sexual Activity  . Alcohol use: Yes    Comment: 1 beer or wine per week  . Drug use: No  . Sexual activity: Yes  Lifestyle  . Physical activity:    Days per week: Not on file    Minutes per session: Not on file  . Stress: Not on file  Relationships  . Social connections:    Talks on phone: Not on file    Gets  together: Not on file    Attends religious service: Not on file    Active member of club or organization: Not on file    Attends meetings of clubs or organizations: Not on file    Relationship status: Not on file  . Intimate partner violence:    Fear of current or ex partner: Not on file    Emotionally abused: Not on file    Physically abused: Not on file    Forced sexual activity: Not on file  Other Topics Concern  . Not on file  Social History Narrative  . Not on file    Family History  Problem Relation Age of Onset  . High blood pressure Mother   . Hypertension Mother   . High blood pressure Father   . Diabetes Father   . Heart disease Father   . Hypertension Father   . Hyperlipidemia Father   . Stroke Father   . Cancer Maternal Aunt        stomach,colon  . Cancer Maternal Uncle        lung,prostate  . High blood pressure Maternal Grandmother   . Stroke Maternal Grandmother   . High blood pressure  Maternal Grandfather   . High blood pressure Paternal Grandmother   . Stroke Paternal Grandmother   . High blood pressure Paternal Grandfather   . Heart attack Neg Hx   . Sudden death Neg Hx     BP 117/73   Pulse 76   Ht 6' 1"  (1.854 m)   Wt 195 lb (88.5 kg)   BMI 25.73 kg/m   Review of Systems: See HPI above.     Objective:  Physical Exam:  Gen: NAD, comfortable in exam room  Bilateral feet/ankles: Moderate overpronation.  No other deformity, swelling, ecchymoses. No hallux valgus or rigidus. Mild transverse arch collapse especially at the second metatarsal head. Full range of motion ankles and digits without pain, 5 out of 5 strength. No current tenderness to palpation. Negative anterior drawer and talar tilt. Neurovascularly intact distally. Leg lengths equal.   Assessment & Plan:  1.  Bilateral foot and ankle pain: New custom orthotics made today, dress pair for soccer cleats and dress shoes. Patient was fitted for a : standard, cushioned, semi-rigid  orthotic. The orthotic was heated and afterward the patient stood on the orthotic blank positioned on the orthotic stand. The patient was positioned in subtalar neutral position and 10 degrees of ankle dorsiflexion in a weight bearing stance. After completion of molding, a stable base was applied to the orthotic blank. The blank was ground to a stable position for weight bearing. Size: 12 dress Base: blue med density eva Posting: none Additional orthotic padding: none

## 2018-09-10 ENCOUNTER — Encounter: Payer: Self-pay | Admitting: Physician Assistant

## 2018-09-10 NOTE — Telephone Encounter (Signed)
Symptoms seem more likely eustachian tube dysfunction. Would actually recommend he start with some OTC Flonase to help open tubes and drain the fluid in the inner ear to help with symptoms. Does not sound like external ear infection based on the symptoms given.

## 2019-02-21 DIAGNOSIS — M9905 Segmental and somatic dysfunction of pelvic region: Secondary | ICD-10-CM | POA: Diagnosis not present

## 2019-02-21 DIAGNOSIS — M9901 Segmental and somatic dysfunction of cervical region: Secondary | ICD-10-CM | POA: Diagnosis not present

## 2019-02-21 DIAGNOSIS — M5136 Other intervertebral disc degeneration, lumbar region: Secondary | ICD-10-CM | POA: Diagnosis not present

## 2019-02-21 DIAGNOSIS — M9903 Segmental and somatic dysfunction of lumbar region: Secondary | ICD-10-CM | POA: Diagnosis not present

## 2019-03-02 DIAGNOSIS — M9901 Segmental and somatic dysfunction of cervical region: Secondary | ICD-10-CM | POA: Diagnosis not present

## 2019-03-02 DIAGNOSIS — M5136 Other intervertebral disc degeneration, lumbar region: Secondary | ICD-10-CM | POA: Diagnosis not present

## 2019-03-02 DIAGNOSIS — M9903 Segmental and somatic dysfunction of lumbar region: Secondary | ICD-10-CM | POA: Diagnosis not present

## 2019-03-02 DIAGNOSIS — Z713 Dietary counseling and surveillance: Secondary | ICD-10-CM | POA: Diagnosis not present

## 2019-03-02 DIAGNOSIS — M9905 Segmental and somatic dysfunction of pelvic region: Secondary | ICD-10-CM | POA: Diagnosis not present

## 2019-03-03 ENCOUNTER — Encounter: Payer: Self-pay | Admitting: Physician Assistant

## 2019-03-23 DIAGNOSIS — Z713 Dietary counseling and surveillance: Secondary | ICD-10-CM | POA: Diagnosis not present

## 2019-03-24 ENCOUNTER — Encounter: Payer: Self-pay | Admitting: Physician Assistant

## 2019-04-11 ENCOUNTER — Encounter: Payer: Self-pay | Admitting: Physician Assistant

## 2019-04-11 ENCOUNTER — Other Ambulatory Visit: Payer: Self-pay

## 2019-04-11 ENCOUNTER — Ambulatory Visit (INDEPENDENT_AMBULATORY_CARE_PROVIDER_SITE_OTHER): Payer: BC Managed Care – PPO | Admitting: Physician Assistant

## 2019-04-11 VITALS — BP 116/78 | HR 62 | Temp 98.6°F | Resp 16 | Ht 73.0 in | Wt 188.0 lb

## 2019-04-11 DIAGNOSIS — Z0000 Encounter for general adult medical examination without abnormal findings: Secondary | ICD-10-CM | POA: Diagnosis not present

## 2019-04-11 LAB — COMPREHENSIVE METABOLIC PANEL
ALT: 22 U/L (ref 0–53)
AST: 18 U/L (ref 0–37)
Albumin: 4.7 g/dL (ref 3.5–5.2)
Alkaline Phosphatase: 46 U/L (ref 39–117)
BUN: 15 mg/dL (ref 6–23)
CO2: 30 mEq/L (ref 19–32)
Calcium: 9.9 mg/dL (ref 8.4–10.5)
Chloride: 102 mEq/L (ref 96–112)
Creatinine, Ser: 0.9 mg/dL (ref 0.40–1.50)
GFR: 91.45 mL/min (ref >60.00)
Glucose, Bld: 98 mg/dL (ref 70–99)
Potassium: 4.1 mEq/L (ref 3.5–5.1)
Sodium: 138 mEq/L (ref 135–145)
Total Bilirubin: 0.9 mg/dL (ref 0.2–1.2)
Total Protein: 7.2 g/dL (ref 6.0–8.3)

## 2019-04-11 LAB — LIPID PANEL
Cholesterol: 182 mg/dL (ref 0–200)
HDL: 41.1 mg/dL (ref >39.00)
LDL Cholesterol: 110 mg/dL — ABNORMAL HIGH (ref 0–99)
NonHDL: 140.62
Total CHOL/HDL Ratio: 4
Triglycerides: 152 mg/dL — ABNORMAL HIGH (ref 0.0–149.0)
VLDL: 30.4 mg/dL (ref 0.0–40.0)

## 2019-04-11 LAB — CBC WITH DIFFERENTIAL/PLATELET
Basophils Absolute: 0 10*3/uL (ref 0.0–0.1)
Basophils Relative: 0.8 % (ref 0.0–3.0)
Eosinophils Absolute: 0 10*3/uL (ref 0.0–0.7)
Eosinophils Relative: 0.8 % (ref 0.0–5.0)
HCT: 42.7 % (ref 39.0–52.0)
Hemoglobin: 14.6 g/dL (ref 13.0–17.0)
Lymphocytes Relative: 41.8 % (ref 12.0–46.0)
Lymphs Abs: 1.6 10*3/uL (ref 0.7–4.0)
MCHC: 34.2 g/dL (ref 30.0–36.0)
MCV: 89.3 fl (ref 78.0–100.0)
Monocytes Absolute: 0.4 10*3/uL (ref 0.1–1.0)
Monocytes Relative: 9.5 % (ref 3.0–12.0)
Neutro Abs: 1.8 10*3/uL (ref 1.4–7.7)
Neutrophils Relative %: 47.1 % (ref 43.0–77.0)
Platelets: 165 10*3/uL (ref 150.0–400.0)
RBC: 4.78 Mil/uL (ref 4.22–5.81)
RDW: 12.9 % (ref 11.5–15.5)
WBC: 3.8 10*3/uL — ABNORMAL LOW (ref 4.0–10.5)

## 2019-04-11 LAB — HEMOGLOBIN A1C: Hgb A1c MFr Bld: 5.2 % (ref 4.6–6.5)

## 2019-04-11 NOTE — Progress Notes (Signed)
Patient presents to clinic today for annual exam.  Patient is fasting for labs.  Diet -- No change with COVID. Well-balanced and healthy overall. Alcohol on weekends -- few glasses of wine.  Exercise -- Working at home. Is exercising at home a few times per week. Wife is a Physiological scientist.   Acute Concerns: Denies acute concerns today.   Health Maintenance: Immunizations -- UTD  History reviewed. No pertinent past medical history.  Past Surgical History:  Procedure Laterality Date  . broken wrist  1988  . FRACTURE SURGERY  1993   (L) thumb  . TYMPANOSTOMY TUBE PLACEMENT      No current outpatient medications on file prior to visit.   No current facility-administered medications on file prior to visit.     No Known Allergies  Family History  Problem Relation Age of Onset  . High blood pressure Mother   . Hypertension Mother   . High blood pressure Father   . Diabetes Father   . Heart disease Father   . Hypertension Father   . Hyperlipidemia Father   . Stroke Father   . Cancer Maternal Aunt        stomach,colon  . Cancer Maternal Uncle        lung,prostate  . High blood pressure Maternal Grandmother   . Stroke Maternal Grandmother   . High blood pressure Maternal Grandfather   . High blood pressure Paternal Grandmother   . Stroke Paternal Grandmother   . High blood pressure Paternal Grandfather   . Heart attack Neg Hx   . Sudden death Neg Hx     Social History   Socioeconomic History  . Marital status: Married    Spouse name: Not on file  . Number of children: Not on file  . Years of education: Not on file  . Highest education level: Not on file  Occupational History  . Not on file  Social Needs  . Financial resource strain: Not on file  . Food insecurity    Worry: Not on file    Inability: Not on file  . Transportation needs    Medical: Not on file    Non-medical: Not on file  Tobacco Use  . Smoking status: Former Smoker    Types:  Cigarettes    Quit date: 04/03/2012    Years since quitting: 7.0  . Smokeless tobacco: Never Used  Substance and Sexual Activity  . Alcohol use: Yes    Comment: 1 beer or wine per week  . Drug use: No  . Sexual activity: Yes  Lifestyle  . Physical activity    Days per week: Not on file    Minutes per session: Not on file  . Stress: Not on file  Relationships  . Social Herbalist on phone: Not on file    Gets together: Not on file    Attends religious service: Not on file    Active member of club or organization: Not on file    Attends meetings of clubs or organizations: Not on file    Relationship status: Not on file  . Intimate partner violence    Fear of current or ex partner: Not on file    Emotionally abused: Not on file    Physically abused: Not on file    Forced sexual activity: Not on file  Other Topics Concern  . Not on file  Social History Narrative  . Not on file   Review of  Systems  Constitutional: Negative for fever and weight loss.  HENT: Negative for ear discharge, ear pain, hearing loss and tinnitus.   Eyes: Negative for blurred vision, double vision, photophobia and pain.  Respiratory: Negative for cough and shortness of breath.   Cardiovascular: Negative for chest pain and palpitations.  Gastrointestinal: Negative for abdominal pain, blood in stool, constipation, diarrhea, heartburn, melena, nausea and vomiting.  Genitourinary: Negative for dysuria, flank pain, frequency, hematuria and urgency.  Musculoskeletal: Negative for falls.  Neurological: Negative for dizziness, loss of consciousness and headaches.  Endo/Heme/Allergies: Negative for environmental allergies.  Psychiatric/Behavioral: Negative for depression, hallucinations, substance abuse and suicidal ideas. The patient is not nervous/anxious and does not have insomnia.   All other systems reviewed and are negative.  Resp 16   Ht _0  (1.854 m)   Wt 188 lb (85.3 kg)   BMI 24.80 kg/m    Physical Exam Vitals signs reviewed.  Constitutional:      General: He is not in acute distress.    Appearance: He is well-developed. He is not diaphoretic.  HENT:     Head: Normocephalic and atraumatic.     Right Ear: Tympanic membrane, ear canal and external ear normal.     Left Ear: Tympanic membrane, ear canal and external ear normal.     Nose: Nose normal.     Mouth/Throat:     Pharynx: No posterior oropharyngeal erythema.  Eyes:     Conjunctiva/sclera: Conjunctivae normal.     Pupils: Pupils are equal, round, and reactive to light.  Neck:     Musculoskeletal: Neck supple.     Thyroid: No thyromegaly.  Cardiovascular:     Rate and Rhythm: Normal rate and regular rhythm.     Heart sounds: Normal heart sounds.  Pulmonary:     Effort: Pulmonary effort is normal. No respiratory distress.     Breath sounds: Normal breath sounds. No wheezing or rales.  Chest:     Chest wall: No tenderness.  Abdominal:     General: Bowel sounds are normal. There is no distension.     Palpations: Abdomen is soft. There is no mass.     Tenderness: There is no abdominal tenderness. There is no guarding or rebound.  Lymphadenopathy:     Cervical: No cervical adenopathy.  Skin:    General: Skin is warm and dry.     Findings: No rash.  Neurological:     Mental Status: He is alert and oriented to person, place, and time.     Cranial Nerves: No cranial nerve deficit.  Psychiatric:        Mood and Affect: Mood normal.    Assessment/Plan: 1. Visit for preventive health examination Depression screen negative. Health Maintenance reviewed. Preventive schedule discussed and handout given in AVS. Will obtain fasting labs today.  - CBC w/Diff - Comp Met (CMET) - Hemoglobin A1c - Lipid Profile   Leeanne Rio, [place]

## 2019-04-11 NOTE — Patient Instructions (Signed)
Please go to the lab for blood work.   Our office will call you with your results unless you have chosen to receive results via MyChart.  If your blood work is normal we will follow-up each year for physicals and as scheduled for chronic medical problems.  If anything is abnormal we will treat accordingly and get you in for a follow-up.   Preventive Care 40-44 Years Old, Male Preventive care refers to lifestyle choices and visits with your health care provider that can promote health and wellness. This includes:  A yearly physical exam. This is also called an annual well check.  Regular dental and eye exams.  Immunizations.  Screening for certain conditions.  Healthy lifestyle choices, such as eating a healthy diet, getting regular exercise, not using drugs or products that contain nicotine and tobacco, and limiting alcohol use. What can I expect for my preventive care visit? Physical exam Your health care provider will check:  Height and weight. These may be used to calculate body mass index (BMI), which is a measurement that tells if you are at a healthy weight.  Heart rate and blood pressure.  Your skin for abnormal spots. Counseling Your health care provider may ask you questions about:  Alcohol, tobacco, and drug use.  Emotional well-being.  Home and relationship well-being.  Sexual activity.  Eating habits.  Work and work environment. What immunizations do I need?  Influenza (flu) vaccine  This is recommended every year. Tetanus, diphtheria, and pertussis (Tdap) vaccine  You may need a Td booster every 10 years. Varicella (chickenpox) vaccine  You may need this vaccine if you have not already been vaccinated. Zoster (shingles) vaccine  You may need this after age 60. Measles, mumps, and rubella (MMR) vaccine  You may need at least one dose of MMR if you were born in 1957 or later. You may also need a second dose. Pneumococcal conjugate (PCV13)  vaccine  You may need this if you have certain conditions and were not previously vaccinated. Pneumococcal polysaccharide (PPSV23) vaccine  You may need one or two doses if you smoke cigarettes or if you have certain conditions. Meningococcal conjugate (MenACWY) vaccine  You may need this if you have certain conditions. Hepatitis A vaccine  You may need this if you have certain conditions or if you travel or work in places where you may be exposed to hepatitis A. Hepatitis B vaccine  You may need this if you have certain conditions or if you travel or work in places where you may be exposed to hepatitis B. Haemophilus influenzae type b (Hib) vaccine  You may need this if you have certain risk factors. Human papillomavirus (HPV) vaccine  If recommended by your health care provider, you may need three doses over 6 months. You may receive vaccines as individual doses or as more than one vaccine together in one shot (combination vaccines). Talk with your health care provider about the risks and benefits of combination vaccines. What tests do I need? Blood tests  Lipid and cholesterol levels. These may be checked every 5 years, or more frequently if you are over 50 years old.  Hepatitis C test.  Hepatitis B test. Screening  Lung cancer screening. You may have this screening every year starting at age 55 if you have a 30-pack-year history of smoking and currently smoke or have quit within the past 15 years.  Prostate cancer screening. Recommendations will vary depending on your family history and other risks.  Colorectal cancer   screening. All adults should have this screening starting at age 33 and continuing until age 88. Your health care provider may recommend screening at age 25 if you are at increased risk. You will have tests every 1-10 years, depending on your results and the type of screening test.  Diabetes screening. This is done by checking your blood sugar (glucose) after  you have not eaten for a while (fasting). You may have this done every 1-3 years.  Sexually transmitted disease (STD) testing. Follow these instructions at home: Eating and drinking  Eat a diet that includes fresh fruits and vegetables, whole grains, lean protein, and low-fat dairy products.  Take vitamin and mineral supplements as recommended by your health care provider.  Do not drink alcohol if your health care provider tells you not to drink.  If you drink alcohol: ? Limit how much you have to 0-2 drinks a day. ? Be aware of how much alcohol is in your drink. In the [place] one drink equals one 12 oz bottle of beer (355 mL), one 5 oz glass of wine (148 mL), or one 1 oz glass of hard liquor (44 mL). Lifestyle  Take daily care of your teeth and gums.  Stay active. Exercise for at least 30 minutes on 5 or more days each week.  Do not use any products that contain nicotine or tobacco, such as cigarettes, e-cigarettes, and chewing tobacco. If you need help quitting, ask your health care provider.  If you are sexually active, practice safe sex. Use a condom or other form of protection to prevent STIs (sexually transmitted infections).  Talk with your health care provider about taking a low-dose aspirin every day starting at age 1. What's next?  Go to your health care provider once a year for a well check visit.  Ask your health care provider how often you should have your eyes and teeth checked.  Stay up to date on all vaccines. This information is not intended to replace advice given to you by your health care provider. Make sure you discuss any questions you have with your health care provider. Document Released: 05/18/2015 Document Revised: 04/15/2018 Document Reviewed: 04/15/2018 Elsevier Patient Education  2020 [employer]. .

## 2019-04-13 DIAGNOSIS — Z713 Dietary counseling and surveillance: Secondary | ICD-10-CM | POA: Diagnosis not present

## 2019-05-09 ENCOUNTER — Encounter: Payer: Self-pay | Admitting: Physician Assistant

## 2019-05-09 DIAGNOSIS — J029 Acute pharyngitis, unspecified: Secondary | ICD-10-CM | POA: Diagnosis not present

## 2019-05-09 DIAGNOSIS — J039 Acute tonsillitis, unspecified: Secondary | ICD-10-CM | POA: Diagnosis not present

## 2019-05-10 ENCOUNTER — Encounter: Payer: Self-pay | Admitting: Physician Assistant

## 2019-10-04 ENCOUNTER — Encounter: Payer: Self-pay | Admitting: Physician Assistant

## 2019-10-12 DIAGNOSIS — L821 Other seborrheic keratosis: Secondary | ICD-10-CM | POA: Diagnosis not present

## 2019-10-12 DIAGNOSIS — L111 Transient acantholytic dermatosis [Grover]: Secondary | ICD-10-CM | POA: Diagnosis not present

## 2019-10-12 DIAGNOSIS — D1801 Hemangioma of skin and subcutaneous tissue: Secondary | ICD-10-CM | POA: Diagnosis not present

## 2019-10-12 DIAGNOSIS — L814 Other melanin hyperpigmentation: Secondary | ICD-10-CM | POA: Diagnosis not present

## 2020-02-18 ENCOUNTER — Encounter: Payer: Self-pay | Admitting: Physician Assistant

## 2020-04-12 ENCOUNTER — Other Ambulatory Visit: Payer: Self-pay

## 2020-04-12 ENCOUNTER — Encounter: Payer: Self-pay | Admitting: Physician Assistant

## 2020-04-12 ENCOUNTER — Ambulatory Visit (INDEPENDENT_AMBULATORY_CARE_PROVIDER_SITE_OTHER): Payer: BC Managed Care – PPO | Admitting: Physician Assistant

## 2020-04-12 VITALS — BP 120/78 | HR 73 | Temp 97.6°F | Resp 15 | Ht 74.0 in | Wt 187.8 lb

## 2020-04-12 DIAGNOSIS — Z0000 Encounter for general adult medical examination without abnormal findings: Secondary | ICD-10-CM | POA: Diagnosis not present

## 2020-04-12 DIAGNOSIS — Z125 Encounter for screening for malignant neoplasm of prostate: Secondary | ICD-10-CM | POA: Diagnosis not present

## 2020-04-12 DIAGNOSIS — Z1211 Encounter for screening for malignant neoplasm of colon: Secondary | ICD-10-CM | POA: Diagnosis not present

## 2020-04-12 LAB — COMPREHENSIVE METABOLIC PANEL
ALT: 26 U/L (ref 0–53)
AST: 17 U/L (ref 0–37)
Albumin: 4.4 g/dL (ref 3.5–5.2)
Alkaline Phosphatase: 49 U/L (ref 39–117)
BUN: 18 mg/dL (ref 6–23)
CO2: 32 mEq/L (ref 19–32)
Calcium: 9.5 mg/dL (ref 8.4–10.5)
Chloride: 102 mEq/L (ref 96–112)
Creatinine, Ser: 0.98 mg/dL (ref 0.40–1.50)
GFR: 93.12 mL/min (ref >60.00)
Glucose, Bld: 88 mg/dL (ref 70–99)
Potassium: 4.7 mEq/L (ref 3.5–5.1)
Sodium: 139 mEq/L (ref 135–145)
Total Bilirubin: 0.9 mg/dL (ref 0.2–1.2)
Total Protein: 7 g/dL (ref 6.0–8.3)

## 2020-04-12 LAB — CBC WITH DIFFERENTIAL/PLATELET
Basophils Absolute: 0 10*3/uL (ref 0.0–0.1)
Basophils Relative: 0.9 % (ref 0.0–3.0)
Eosinophils Absolute: 0.1 10*3/uL (ref 0.0–0.7)
Eosinophils Relative: 1.4 % (ref 0.0–5.0)
HCT: 42.9 % (ref 39.0–52.0)
Hemoglobin: 14.6 g/dL (ref 13.0–17.0)
Lymphocytes Relative: 33.7 % (ref 12.0–46.0)
Lymphs Abs: 1.5 10*3/uL (ref 0.7–4.0)
MCHC: 34.1 g/dL (ref 30.0–36.0)
MCV: 89.9 fl (ref 78.0–100.0)
Monocytes Absolute: 0.4 10*3/uL (ref 0.1–1.0)
Monocytes Relative: 9.5 % (ref 3.0–12.0)
Neutro Abs: 2.4 10*3/uL (ref 1.4–7.7)
Neutrophils Relative %: 54.5 % (ref 43.0–77.0)
Platelets: 186 10*3/uL (ref 150.0–400.0)
RBC: 4.77 Mil/uL (ref 4.22–5.81)
RDW: 13.5 % (ref 11.5–15.5)
WBC: 4.4 10*3/uL (ref 4.0–10.5)

## 2020-04-12 LAB — LIPID PANEL
Cholesterol: 172 mg/dL (ref 0–200)
HDL: 41.1 mg/dL (ref >39.00)
LDL Cholesterol: 105 mg/dL — ABNORMAL HIGH (ref 0–99)
NonHDL: 131.03
Total CHOL/HDL Ratio: 4
Triglycerides: 132 mg/dL (ref 0.0–149.0)
VLDL: 26.4 mg/dL (ref 0.0–40.0)

## 2020-04-12 LAB — PSA: PSA: 0.26 ng/mL (ref 0.10–4.00)

## 2020-04-12 LAB — HEMOGLOBIN A1C: Hgb A1c MFr Bld: 5.3 % (ref 4.6–6.5)

## 2020-04-12 NOTE — Patient Instructions (Signed)
Please go to the lab for blood work.   Our office will call you with your results unless you have chosen to receive results via MyChart.  If your blood work is normal we will follow-up each year for physicals and as scheduled for chronic medical problems.  If anything is abnormal we will treat accordingly and get you in for a follow-up.   Preventive Care 40-45 Years Old, Male Preventive care refers to lifestyle choices and visits with your health care provider that can promote health and wellness. This includes:  A yearly physical exam. This is also called an annual well check.  Regular dental and eye exams.  Immunizations.  Screening for certain conditions.  Healthy lifestyle choices, such as eating a healthy diet, getting regular exercise, not using drugs or products that contain nicotine and tobacco, and limiting alcohol use. What can I expect for my preventive care visit? Physical exam Your health care provider will check:  Height and weight. These may be used to calculate body mass index (BMI), which is a measurement that tells if you are at a healthy weight.  Heart rate and blood pressure.  Your skin for abnormal spots. Counseling Your health care provider may ask you questions about:  Alcohol, tobacco, and drug use.  Emotional well-being.  Home and relationship well-being.  Sexual activity.  Eating habits.  Work and work environment. What immunizations do I need?  Influenza (flu) vaccine  This is recommended every year. Tetanus, diphtheria, and pertussis (Tdap) vaccine  You may need a Td booster every 10 years. Varicella (chickenpox) vaccine  You may need this vaccine if you have not already been vaccinated. Zoster (shingles) vaccine  You may need this after age 60. Measles, mumps, and rubella (MMR) vaccine  You may need at least one dose of MMR if you were born in 1957 or later. You may also need a second dose. Pneumococcal conjugate (PCV13)  vaccine  You may need this if you have certain conditions and were not previously vaccinated. Pneumococcal polysaccharide (PPSV23) vaccine  You may need one or two doses if you smoke cigarettes or if you have certain conditions. Meningococcal conjugate (MenACWY) vaccine  You may need this if you have certain conditions. Hepatitis A vaccine  You may need this if you have certain conditions or if you travel or work in places where you may be exposed to hepatitis A. Hepatitis B vaccine  You may need this if you have certain conditions or if you travel or work in places where you may be exposed to hepatitis B. Haemophilus influenzae type b (Hib) vaccine  You may need this if you have certain risk factors. Human papillomavirus (HPV) vaccine  If recommended by your health care provider, you may need three doses over 6 months. You may receive vaccines as individual doses or as more than one vaccine together in one shot (combination vaccines). Talk with your health care provider about the risks and benefits of combination vaccines. What tests do I need? Blood tests  Lipid and cholesterol levels. These may be checked every 5 years, or more frequently if you are over 50 years old.  Hepatitis C test.  Hepatitis B test. Screening  Lung cancer screening. You may have this screening every year starting at age 55 if you have a 30-pack-year history of smoking and currently smoke or have quit within the past 15 years.  Prostate cancer screening. Recommendations will vary depending on your family history and other risks.  Colorectal cancer   screening. All adults should have this screening starting at age 50 and continuing until age 75. Your health care provider may recommend screening at age 45 if you are at increased risk. You will have tests every 1-10 years, depending on your results and the type of screening test.  Diabetes screening. This is done by checking your blood sugar (glucose) after  you have not eaten for a while (fasting). You may have this done every 1-3 years.  Sexually transmitted disease (STD) testing. Follow these instructions at home: Eating and drinking  Eat a diet that includes fresh fruits and vegetables, whole grains, lean protein, and low-fat dairy products.  Take vitamin and mineral supplements as recommended by your health care provider.  Do not drink alcohol if your health care provider tells you not to drink.  If you drink alcohol: ? Limit how much you have to 0-2 drinks a day. ? Be aware of how much alcohol is in your drink. In the [place] one drink equals one 12 oz bottle of beer (355 mL), one 5 oz glass of wine (148 mL), or one 1 oz glass of hard liquor (44 mL). Lifestyle  Take daily care of your teeth and gums.  Stay active. Exercise for at least 30 minutes on 5 or more days each week.  Do not use any products that contain nicotine or tobacco, such as cigarettes, e-cigarettes, and chewing tobacco. If you need help quitting, ask your health care provider.  If you are sexually active, practice safe sex. Use a condom or other form of protection to prevent STIs (sexually transmitted infections).  Talk with your health care provider about taking a low-dose aspirin every day starting at age 50. What's next?  Go to your health care provider once a year for a well check visit.  Ask your health care provider how often you should have your eyes and teeth checked.  Stay up to date on all vaccines. This information is not intended to replace advice given to you by your health care provider. Make sure you discuss any questions you have with your health care provider. Document Revised: 04/15/2018 Document Reviewed: 04/15/2018 Elsevier Patient Education  2020 [employer] .      

## 2020-04-12 NOTE — Progress Notes (Signed)
Patient presents to clinic today for annual exam.  Patient is fasting for labs. Denies acute concerns today's visit. Endorses well-balanced diet with improved exercise. Is resting very well at night and notes a stable mood.   Health Maintenance: Immunizations -- Declines flu shot. Tetanus and COVID vaccines UTD. Colon Cancer Screening -- Due. Average risk. Is willing to proceed with colon cancer screening.     HIV/Hep C Screening -- Declined.   History reviewed. No pertinent past medical history.  Past Surgical History:  Procedure Laterality Date  . broken wrist  1988  . FRACTURE SURGERY  1993   (L) thumb  . TYMPANOSTOMY TUBE PLACEMENT      No current outpatient medications on file prior to visit.   No current facility-administered medications on file prior to visit.    No Known Allergies  Family History  Problem Relation Age of Onset  . High blood pressure Mother   . Hypertension Mother   . High blood pressure Father   . Diabetes Father   . Heart disease Father   . Hypertension Father   . Hyperlipidemia Father   . Stroke Father   . Cancer Maternal Aunt        stomach  . Cancer Maternal Uncle        lung,prostate  . High blood pressure Maternal Grandmother   . Stroke Maternal Grandmother   . High blood pressure Maternal Grandfather   . High blood pressure Paternal Grandmother   . Stroke Paternal Grandmother   . High blood pressure Paternal Grandfather   . Heart attack Neg Hx   . Sudden death Neg Hx     Social History   Socioeconomic History  . Marital status: Married    Spouse name: Not on file  . Number of children: Not on file  . Years of education: Not on file  . Highest education level: Not on file  Occupational History  . Not on file  Tobacco Use  . Smoking status: Former Smoker    Types: Cigarettes    Quit date: 04/03/2012    Years since quitting: 8.0  . Smokeless tobacco: Never Used  Substance and Sexual Activity  . Alcohol use: Yes     Comment: 1 beer or wine per week  . Drug use: No  . Sexual activity: Yes  Other Topics Concern  . Not on file  Social History Narrative  . Not on file   Social Determinants of Health   Financial Resource Strain: Not on file  Food Insecurity: Not on file  Transportation Needs: Not on file  Physical Activity: Not on file  Stress: Not on file  Social Connections: Not on file  Intimate Partner Violence: Not on file   Review of Systems  Constitutional: Negative for fever and weight loss.  HENT: Negative for ear discharge, ear pain, hearing loss and tinnitus.   Eyes: Negative for blurred vision, double vision, photophobia and pain.  Respiratory: Negative for cough and shortness of breath.   Cardiovascular: Negative for chest pain and palpitations.  Gastrointestinal: Negative for abdominal pain, blood in stool, constipation, diarrhea, heartburn, melena, nausea and vomiting.  Genitourinary: Negative for dysuria, flank pain, frequency, hematuria and urgency.  Musculoskeletal: Negative for falls.  Neurological: Negative for dizziness, loss of consciousness and headaches.  Endo/Heme/Allergies: Negative for environmental allergies.  Psychiatric/Behavioral: Negative for depression, hallucinations, substance abuse and suicidal ideas. The patient is not nervous/anxious and does not have insomnia.     BP 120/78  Pulse 73   Temp 97.6 F (36.4 C) (Temporal)   Resp 15   Ht 6' 2"  (1.88 m)   Wt 187 lb 12.8 oz (85.2 kg)   SpO2 99%   BMI 24.11 kg/m   Physical Exam Vitals reviewed.  Constitutional:      General: He is not in acute distress.    Appearance: He is well-developed and well-nourished. He is not diaphoretic.  HENT:     Head: Normocephalic and atraumatic.     Right Ear: Tympanic membrane, ear canal and external ear normal.     Left Ear: Tympanic membrane, ear canal and external ear normal.     Nose: Nose normal.     Mouth/Throat:     Mouth: Oropharynx is clear and moist and  mucous membranes are normal.     Pharynx: No posterior oropharyngeal edema or posterior oropharyngeal erythema.  Eyes:     Conjunctiva/sclera: Conjunctivae normal.     Pupils: Pupils are equal, round, and reactive to light.  Neck:     Thyroid: No thyromegaly.  Cardiovascular:     Rate and Rhythm: Normal rate and regular rhythm.     Pulses: Intact distal pulses.     Heart sounds: Normal heart sounds.  Pulmonary:     Effort: Pulmonary effort is normal. No respiratory distress.     Breath sounds: Normal breath sounds. No wheezing or rales.  Chest:     Chest wall: No tenderness.  Abdominal:     General: Bowel sounds are normal. There is no distension.     Palpations: Abdomen is soft. There is no mass.     Tenderness: There is no abdominal tenderness. There is no guarding or rebound.  Musculoskeletal:     Cervical back: Neck supple.  Lymphadenopathy:     Cervical: No cervical adenopathy.  Skin:    General: Skin is warm and dry.     Findings: No rash.  Neurological:     Mental Status: He is alert and oriented to person, place, and time.     Cranial Nerves: No cranial nerve deficit.  Psychiatric:        Mood and Affect: Mood and affect normal.    Assessment/Plan: 1. Visit for preventive health examination Depression screen negative. Health Maintenance reviewed. Preventive schedule discussed and handout given in AVS. Will obtain fasting labs today.  - CBC with Differential/Platelet - Comprehensive metabolic panel - Hemoglobin A1c - Lipid panel  2. Colon cancer screening Average risk. Asymptomatic. Discussed options. Patient has elected to proceed with Cologuard. Order placed.  - Cologuard  3. Prostate cancer screening He indicates understanding of the limitations of this screening test and wishes to proceed with screening PSA testing.  - PSA   This visit occurred during the SARS-CoV-2 public health emergency.  Safety protocols were in place, including screening  questions prior to the visit, additional usage of staff PPE, and extensive cleaning of exam room while observing appropriate contact time as indicated for disinfecting solutions.    Leeanne Rio, PA-C

## 2020-05-08 DIAGNOSIS — Z1212 Encounter for screening for malignant neoplasm of rectum: Secondary | ICD-10-CM | POA: Diagnosis not present

## 2020-05-08 DIAGNOSIS — Z1211 Encounter for screening for malignant neoplasm of colon: Secondary | ICD-10-CM | POA: Diagnosis not present

## 2020-05-08 LAB — COLOGUARD: Cologuard: NEGATIVE

## 2020-05-16 LAB — COLOGUARD: Cologuard: NEGATIVE

## 2020-05-30 ENCOUNTER — Encounter: Payer: Self-pay | Admitting: Physician Assistant

## 2020-05-31 NOTE — Telephone Encounter (Signed)
Can you check Cologuard portal for potential results for patient?

## 2020-06-01 ENCOUNTER — Encounter: Payer: Self-pay | Admitting: Emergency Medicine

## 2020-10-05 DIAGNOSIS — R519 Headache, unspecified: Secondary | ICD-10-CM | POA: Diagnosis not present

## 2020-10-05 DIAGNOSIS — R0981 Nasal congestion: Secondary | ICD-10-CM | POA: Diagnosis not present

## 2020-10-05 DIAGNOSIS — Z20822 Contact with and (suspected) exposure to covid-19: Secondary | ICD-10-CM | POA: Diagnosis not present

## 2020-10-05 DIAGNOSIS — R0982 Postnasal drip: Secondary | ICD-10-CM | POA: Diagnosis not present

## 2020-10-05 DIAGNOSIS — B349 Viral infection, unspecified: Secondary | ICD-10-CM | POA: Diagnosis not present

## 2020-10-11 DIAGNOSIS — L814 Other melanin hyperpigmentation: Secondary | ICD-10-CM | POA: Diagnosis not present

## 2020-10-11 DIAGNOSIS — L110 Acquired keratosis follicularis: Secondary | ICD-10-CM | POA: Diagnosis not present

## 2020-10-11 DIAGNOSIS — L821 Other seborrheic keratosis: Secondary | ICD-10-CM | POA: Diagnosis not present

## 2020-10-11 DIAGNOSIS — D225 Melanocytic nevi of trunk: Secondary | ICD-10-CM | POA: Diagnosis not present

## 2020-10-20 DIAGNOSIS — J0110 Acute frontal sinusitis, unspecified: Secondary | ICD-10-CM | POA: Diagnosis not present

## 2020-10-22 ENCOUNTER — Encounter: Payer: Self-pay | Admitting: Family Medicine

## 2020-10-22 ENCOUNTER — Telehealth (INDEPENDENT_AMBULATORY_CARE_PROVIDER_SITE_OTHER): Payer: BC Managed Care – PPO | Admitting: Family Medicine

## 2020-10-22 ENCOUNTER — Other Ambulatory Visit: Payer: Self-pay

## 2020-10-22 DIAGNOSIS — J329 Chronic sinusitis, unspecified: Secondary | ICD-10-CM

## 2020-10-22 DIAGNOSIS — B9689 Other specified bacterial agents as the cause of diseases classified elsewhere: Secondary | ICD-10-CM

## 2020-10-22 MED ORDER — FLUTICASONE PROPIONATE 50 MCG/ACT NA SUSP: 2.0000 | Freq: Every day | NASAL | 6 refills | Status: DC

## 2020-10-22 MED ORDER — CETIRIZINE HCL 10 MG PO TABS: 10.0000 mg | ORAL_TABLET | Freq: Every day | ORAL | 3 refills | Status: DC

## 2020-10-22 NOTE — Progress Notes (Signed)
   Virtual Visit via Video   I connected with patient on 10/22/20 at 11:30 AM EDT by a video enabled telemedicine application and verified that I am speaking with the correct person using two identifiers.  Location patient: Home Location provider: [facility] Office Persons participating in the virtual visit: Patient, Provider, [place] (Sabrina M)  I discussed the limitations of evaluation and management by telemedicine and the availability of in person appointments. The patient expressed understanding and agreed to proceed.  Subjective:   HPI:   Sinusitis- sxs started 5/31 w/ congestion, sore throat, cough.  On June 3rd went to [facility] and was dx'd w/ viral illness.  Took multiple COVID tests that were negative.  On 6/18 went back to [facility] and was prescribed Omnicef 384m BID x10 days.  [place]  Wants to make sure he is doing 'everything that I can to get rid of the infection'.  Pt reports feeling better since starting abx Saturday.  No longer has maxillary pain, congestion is improving.  Mild frontal sinus pressure remains.  ROS:   See pertinent positives and negatives per HPI.  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 AAOx3, NAD NCAT, EOMI No obvious CN  deficits Coloring WNL Pt is able to speak clearly, coherently without shortness of breath or increased work of breathing.  Thought process is linear.  Mood is appropriate.   Assessment and Plan:   Bacterial sinusitis- new.  Reviewed UC note from 6/18 and agree w/ Omnicef as prescribed.  Since he is flying on Thursday, also recommended starting daily antihistamine and nasal steroid to improve congestion and better regulate pressure in the eustachian tubes.  Pt expressed understanding and is in agreement w/ plan.    KAnnye Asa MD 10/22/2020

## 2020-10-22 NOTE — Progress Notes (Signed)
I connected with  Harbor Tandy on 10/22/20 by a video enabled telemedicine application and verified that I am speaking with the correct person using two identifiers.   I discussed the limitations of evaluation and management by telemedicine. The patient expressed understanding and agreed to proceed.

## 2020-10-31 ENCOUNTER — Encounter: Payer: Self-pay | Admitting: *Deleted

## 2020-12-30 ENCOUNTER — Other Ambulatory Visit: Payer: Self-pay

## 2020-12-30 ENCOUNTER — Emergency Department (HOSPITAL_BASED_OUTPATIENT_CLINIC_OR_DEPARTMENT_OTHER)
Admission: EM | Admit: 2020-12-30 | Discharge: 2020-12-30 | Disposition: A | Discharge status: Home / Self Care | Payer: BC Managed Care – PPO | Attending: Emergency Medicine | Admitting: Emergency Medicine

## 2020-12-30 ENCOUNTER — Encounter (HOSPITAL_BASED_OUTPATIENT_CLINIC_OR_DEPARTMENT_OTHER): Payer: Self-pay | Admitting: Obstetrics and Gynecology

## 2020-12-30 ENCOUNTER — Emergency Department (HOSPITAL_BASED_OUTPATIENT_CLINIC_OR_DEPARTMENT_OTHER): Payer: BC Managed Care – PPO

## 2020-12-30 DIAGNOSIS — R42 Dizziness and giddiness: Secondary | ICD-10-CM | POA: Insufficient documentation

## 2020-12-30 DIAGNOSIS — R0602 Shortness of breath: Secondary | ICD-10-CM | POA: Diagnosis not present

## 2020-12-30 DIAGNOSIS — R110 Nausea: Secondary | ICD-10-CM | POA: Insufficient documentation

## 2020-12-30 DIAGNOSIS — Z87891 Personal history of nicotine dependence: Secondary | ICD-10-CM | POA: Insufficient documentation

## 2020-12-30 DIAGNOSIS — Z20822 Contact with and (suspected) exposure to covid-19: Secondary | ICD-10-CM | POA: Insufficient documentation

## 2020-12-30 DIAGNOSIS — R5383 Other fatigue: Secondary | ICD-10-CM | POA: Diagnosis not present

## 2020-12-30 LAB — URINALYSIS, ROUTINE W REFLEX MICROSCOPIC
Bilirubin Urine: NEGATIVE
Glucose, UA: NEGATIVE mg/dL
Hgb urine dipstick: NEGATIVE
Ketones, ur: NEGATIVE mg/dL
Leukocytes,Ua: NEGATIVE
Nitrite: NEGATIVE
Protein, ur: NEGATIVE mg/dL
Specific Gravity, Urine: 1.011 (ref 1.005–1.030)
pH: 7 (ref 5.0–8.0)

## 2020-12-30 LAB — BASIC METABOLIC PANEL
Anion gap: 9 (ref 5–15)
BUN: 17 mg/dL (ref 6–20)
CO2: 28 mmol/L (ref 22–32)
Calcium: 9.7 mg/dL (ref 8.9–10.3)
Chloride: 102 mmol/L (ref 98–111)
Creatinine, Ser: 0.94 mg/dL (ref 0.61–1.24)
GFR, Estimated: >60 mL/min (ref >60)
Glucose, Bld: 141 mg/dL — ABNORMAL HIGH (ref 70–99)
Potassium: 3.5 mmol/L (ref 3.5–5.1)
Sodium: 139 mmol/L (ref 135–145)

## 2020-12-30 LAB — TROPONIN I (HIGH SENSITIVITY): Troponin I (High Sensitivity): 6 ng/L (ref <18)

## 2020-12-30 LAB — CBC
HCT: 41.1 % (ref 39.0–52.0)
Hemoglobin: 14.3 g/dL (ref 13.0–17.0)
MCH: 30.2 pg (ref 26.0–34.0)
MCHC: 34.8 g/dL (ref 30.0–36.0)
MCV: 86.7 fL (ref 80.0–100.0)
Platelets: 173 10*3/uL (ref 150–400)
RBC: 4.74 MIL/uL (ref 4.22–5.81)
RDW: 12.5 % (ref 11.5–15.5)
WBC: 4.5 10*3/uL (ref 4.0–10.5)
nRBC: 0 % (ref 0.0–0.2)

## 2020-12-30 LAB — TSH: TSH: 1.331 u[IU]/mL (ref 0.350–4.500)

## 2020-12-30 LAB — RESP PANEL BY RT-PCR (FLU A&B, COVID) ARPGX2
Influenza A by PCR: NEGATIVE
Influenza B by PCR: NEGATIVE
SARS Coronavirus 2 by RT PCR: NEGATIVE

## 2020-12-30 LAB — BRAIN NATRIURETIC PEPTIDE: B Natriuretic Peptide: 8.3 pg/mL (ref 0.0–100.0)

## 2020-12-30 LAB — D-DIMER, QUANTITATIVE: D-Dimer, Quant: 0.41 ug/mL-FEU (ref 0.00–0.50)

## 2020-12-30 MED ORDER — ONDANSETRON 4 MG PO TBDP: 4.0000 mg | ORAL_TABLET | Freq: Three times a day (TID) | ORAL | 0 refills | Status: DC | PRN

## 2020-12-30 MED ORDER — ONDANSETRON 4 MG PO TBDP
4.0000 mg | ORAL_TABLET | Freq: Once | ORAL | Status: AC
  Administered 2020-12-30: 4 mg via ORAL
  Filled 2020-12-30: qty 1

## 2020-12-30 NOTE — ED Notes (Signed)
Dr Doren Custard in room w/pt now.

## 2020-12-30 NOTE — ED Triage Notes (Signed)
Patient reports he started having increasing weakness x2 weeks ago. Patient reports he will start to feel better and then will feel weaker and more fatigued and lightheaded. Patient reports feeling more Shob with activity that would not normally make him feel that way

## 2020-12-30 NOTE — ED Provider Notes (Signed)
[facility] EMERGENCY DEPT Provider Note   CSN: [id number] Arrival date & time: 12/30/20  1123     History Chief Complaint  Patient presents with   Weakness   Shortness of Breath    Edwin Ryan is a 45 y.o. male.   Weakness Associated symptoms: dizziness, nausea and shortness of breath (Exertional)   Associated symptoms: no abdominal pain, no arthralgias, no chest pain, no cough, no diarrhea, no dysuria, no fever, no myalgias, no seizures, no urgency and no vomiting   Shortness of Breath Associated symptoms: no abdominal pain, no chest pain, no cough, no diaphoresis, no ear pain, no fever, no neck pain, no rash, no sore throat, no vomiting and no wheezing   Patient presents for fatigue over the past week and worsening over the past several days.  He also endorses some intermittent exertional dyspnea, nausea, lightheadedness, near syncopal symptoms.  He denies any history of chronic medical conditions.  He states that he has always been in good health and has always stayed active.  He has not had any recent changes in his diet.  He has not started any new medications or supplements.  He states that his father had onset of heart problems in his 74s.  He is worried about this.  He states that he has never experienced these symptoms before.  Currently, patient denies any symptoms at all.  He states that they have been intermittent but increasing in frequency over the past several days.    History reviewed. No pertinent past medical history.  Patient Active Problem List   Diagnosis Date Noted   Visit for preventive health examination 07/29/2016   Allergic rhinitis 12/08/2013   Bilateral foot pain 06/14/2012   Abnormality of gait 06/14/2012   Flat feet 06/03/2012    Past Surgical History:  Procedure Laterality Date   broken wrist  [facility]ick   (L) thumb   TYMPANOSTOMY TUBE PLACEMENT         Family History  Problem Relation Age of Onset    High blood pressure Mother    Hypertension Mother    High blood pressure Father    Diabetes Father    Heart disease Father    Hypertension Father    Hyperlipidemia Father    Stroke Father    Cancer Maternal Aunt        stomach   Cancer Maternal Uncle        lung,prostate   High blood pressure Maternal Grandmother    Stroke Maternal Grandmother    High blood pressure Maternal Grandfather    High blood pressure Paternal Grandmother    Stroke Paternal Grandmother    High blood pressure Paternal Grandfather    Heart attack Neg Hx    Sudden death Neg Hx     Social History   Tobacco Use   Smoking status: Former    Types: Cigarettes    Quit date: 04/03/2012    Years since quitting: 8.7   Smokeless tobacco: Never  Vaping Use   Vaping Use: Never used  Substance Use Topics   Alcohol use: Yes    Comment: 1 beer or wine per week   Drug use: No    Home Medications Prior to Admission medications   Medication Sig Start Date End Date Taking? Authorizing Provider  ondansetron (ZOFRAN ODT) 4 MG disintegrating tablet Take 1 tablet (4 mg total) by mouth every 8 (eight) hours as needed for up to 6 doses for nausea  or vomiting. 12/30/20  Yes Godfrey Pick, MD  cetirizine (ZYRTEC) 10 MG tablet Take 1 tablet (10 mg total) by mouth daily. 10/22/20   Midge Minium, MD  fluticasone (FLONASE) 50 MCG/ACT nasal spray Place 2 sprays into both nostrils daily. 10/22/20   Midge Minium, MD  pseudoephedrine (SUDAFED) 30 MG tablet Take by mouth.    [provider]    Allergies    Patient has no known allergies.  Review of Systems   Review of Systems  Constitutional:  Positive for fatigue. Negative for activity change, appetite change, chills, diaphoresis and fever.  HENT:  Negative for congestion, ear pain, rhinorrhea, sinus pain and sore throat.   Eyes:  Negative for pain and visual disturbance.  Respiratory:  Positive for shortness of breath (Exertional). Negative for cough,  chest tightness and wheezing.   Cardiovascular:  Negative for chest pain, palpitations and leg swelling.  Gastrointestinal:  Positive for nausea. Negative for abdominal pain, diarrhea and vomiting.  Genitourinary:  Negative for dysuria, flank pain, hematuria and urgency.  Musculoskeletal:  Negative for arthralgias, back pain, gait problem, joint swelling, myalgias and neck pain.  Skin:  Negative for color change and rash.  Neurological:  Positive for dizziness, weakness (Generalized) and light-headedness. Negative for tremors, seizures, syncope, speech difficulty and numbness.  Hematological:  Does not bruise/bleed easily.  Psychiatric/Behavioral:  Negative for confusion and decreased concentration.   All other systems reviewed and are negative.  Physical Exam Updated Vital Signs BP 121/88 (BP Location: Right Arm)   Pulse 61   Temp 98.3 F (36.8 C)   Resp 16   SpO2 100%   Physical Exam Vitals and nursing note reviewed.  Constitutional:      General: He is not in acute distress.    Appearance: He is well-developed and normal weight. He is not ill-appearing, toxic-appearing or diaphoretic.  HENT:     Head: Normocephalic and atraumatic.     Mouth/Throat:     Mouth: Mucous membranes are moist.     Pharynx: Oropharynx is clear.  Eyes:     General: No visual field deficit.    Extraocular Movements: Extraocular movements intact.     Conjunctiva/sclera: Conjunctivae normal.  Cardiovascular:     Rate and Rhythm: Normal rate and regular rhythm.     Heart sounds: No murmur heard. Pulmonary:     Effort: Pulmonary effort is normal. No respiratory distress.     Breath sounds: Normal breath sounds. No rales.  Chest:     Chest wall: No tenderness.  Abdominal:     Palpations: Abdomen is soft.     Tenderness: There is no abdominal tenderness.  Musculoskeletal:     Cervical back: Normal range of motion and neck supple.     Right lower leg: No edema.     Left lower leg: No edema.   Lymphadenopathy:     Cervical: No cervical adenopathy.  Skin:    General: Skin is warm and dry.     Capillary Refill: Capillary refill takes less than 2 seconds.  Neurological:     General: No focal deficit present.     Mental Status: He is alert and oriented to person, place, and time.     Cranial Nerves: Cranial nerves are intact. No cranial nerve deficit, dysarthria or facial asymmetry.     Sensory: Sensation is intact. No sensory deficit.     Motor: No weakness, abnormal muscle tone or pronator drift.     Coordination: Coordination is intact.  Finger-Nose-Finger Test normal.     Gait: Gait is intact. Gait normal.  Psychiatric:        Mood and Affect: Mood normal.        Behavior: Behavior normal.    ED Results / Procedures / Treatments   Labs (all labs ordered are listed, but only abnormal results are displayed) Labs Reviewed  BASIC METABOLIC PANEL - Abnormal; Notable for the following components:      Result Value   Glucose, Bld 141 (*)    All other components within normal limits  RESP PANEL BY RT-PCR (FLU A&B, COVID) ARPGX2  CBC  URINALYSIS, ROUTINE W REFLEX MICROSCOPIC  TSH  D-DIMER, QUANTITATIVE  BRAIN NATRIURETIC PEPTIDE  TROPONIN I (HIGH SENSITIVITY)    EKG EKG Interpretation  Date/Time:  Sunday December 30 2020 11:40:43 EDT Ventricular Rate:  82 PR Interval:  202 QRS Duration: 80 QT Interval:  374 QTC Calculation: 436 R Axis:   72 Text Interpretation: Normal sinus rhythm Normal ECG Confirmed by Anae Hams ([id number]) on 12/30/2020 1:35:43 PM  Radiology DG Chest Portable 1 View  Result Date: 12/30/2020 CLINICAL DATA:  Shortness of breath with exertion. EXAM: PORTABLE CHEST 1 VIEW COMPARISON:  None. FINDINGS: The heart size and mediastinal contours are within normal limits. Both lungs are clear. The visualized skeletal structures are unremarkable. IMPRESSION: No active disease. Electronically Signed   By: David  Williams III M.D.   On: 12/30/2020 14:27     Procedures Procedures   Medications Ordered in ED Medications  ondansetron (ZOFRAN-ODT) disintegrating tablet 4 mg (4 mg Oral Given 12/30/20 1528)    ED Course  I have reviewed the triage vital signs and the nursing notes.  Pertinent labs & imaging results that were available during my care of the patient were reviewed by me and considered in my medical decision making (see chart for details).    MDM Rules/Calculators/A&P                          Patient presents for symptoms of fatigue in addition to intermittent symptoms of nausea, exertional shortness of breath, near syncope.  Asymptomatic upon arrival in the ED.  Patient's vital signs are normal.  Broad work-up was initiated to identify underlying metabolic, cardiac, or infectious etiologies.  Results of work-up were all reassuring.  Patient continued to endorse no symptoms while in the ED.  Upon standing, he was asymptomatic and did not experience the lightheadedness that he had experienced at times over the past few days.  On measurement of orthostatic vital signs, patient's SBP actually increased when he stood up.  Given his reassuring work-up, feel the patient is appropriate for discharge home.  I do feel that he would benefit from PCP follow-up for reevaluation and possible further testing.  Patient was agreeable to this.  He was discharged in good condition.  Final Clinical Impression(s) / ED Diagnoses Final diagnoses:  Fatigue, unspecified type    Rx / DC Orders ED Discharge Orders          Ordered    ondansetron (ZOFRAN ODT) 4 MG disintegrating tablet  Every 8 hours PRN        08 /28/22 1537             Godfrey Pick, MD 12/31/20 1735

## 2020-12-31 ENCOUNTER — Encounter: Payer: Self-pay | Admitting: Family Medicine

## 2021-01-15 DIAGNOSIS — M67442 Ganglion, left hand: Secondary | ICD-10-CM | POA: Insufficient documentation

## 2021-01-15 DIAGNOSIS — L723 Sebaceous cyst: Secondary | ICD-10-CM | POA: Insufficient documentation

## 2021-01-18 ENCOUNTER — Ambulatory Visit (INDEPENDENT_AMBULATORY_CARE_PROVIDER_SITE_OTHER): Payer: BC Managed Care – PPO | Admitting: Family Medicine

## 2021-01-18 ENCOUNTER — Other Ambulatory Visit: Payer: Self-pay

## 2021-01-18 ENCOUNTER — Encounter: Payer: Self-pay | Admitting: Family Medicine

## 2021-01-18 VITALS — BP 122/80 | HR 68 | Temp 99.1°F | Resp 16 | Ht 74.0 in | Wt 189.4 lb

## 2021-01-18 DIAGNOSIS — F419 Anxiety disorder, unspecified: Secondary | ICD-10-CM | POA: Diagnosis not present

## 2021-01-18 NOTE — Progress Notes (Signed)
   Subjective:    Patient ID: Edwin Ryan, male    DOB: Dec 25, 1975, 45 y.o.   MRN: [id number]  HPI ER f/u- pt was seen on 8/28 due to fatigue, exertional SOB, and near syncope.  ER workup was reassuring- normal labs, normal EKG, normal CXR.  Pt reports being asymptomatic since his visit.  Pt reports he did not feel well at all.  'i'm going to pass out or vomit'.  Went to UC and was not able to be seen.  Went to [employer].  Was feeling 'weak in my body'.  Then felt 'restless' and was mildly short of breath.  Aware that he was anxious about his situation.  Pt reports he had been feeling that way off and on for 2 weeks preceding his visit.  Pt's father passed, got a new job, more responsibility, had to lay people off.  Pt feels that going to the ER was 'a wake up call'.  Pt is making his mental health a priority over the past few weeks.  Pt has reached out to [facility].   Review of Systems For ROS see HPI   This visit occurred during the SARS-CoV-2 public health emergency.  Safety protocols were in place, including screening questions prior to the visit, additional usage of staff PPE, and extensive cleaning of exam room while observing appropriate contact time as indicated for disinfecting solutions.      Objective:   Physical Exam Vitals reviewed.  Constitutional:      General: He is not in acute distress.    Appearance: Normal appearance. He is not ill-appearing.  HENT:     Head: Normocephalic and atraumatic.  Cardiovascular:     Rate and Rhythm: Normal rate and regular rhythm.  Skin:    General: Skin is warm and dry.     Findings: No rash.  Neurological:     General: No focal deficit present.     Mental Status: He is alert and oriented to person, place, and time.  Psychiatric:        Mood and Affect: Mood normal.        Behavior: Behavior normal.        Thought Content: Thought content normal.          Assessment & Plan:  Anxiety- new.  Pt went to ER on 8/28 feeling weak,  shaky, nauseous, dizzy.  His exam, labs, EKG, and CXR were all normal.  He realized that he had been under considerable stress and very anxious but up until that point, he had not acknowledged the stress.  Since then he has worked on mindfulness, meditation, stress management, exercise, time for his hobbies, and pulling back from things that don't necessarily require his time/attention.  Since he has prioritized his emotional well being he has felt very good physically.  No need for further work up at this time.  We did discuss the possibility of medication if his lifestyle changes were not effective.  Pt understands this is an option.  Total time spent w/ pt 30 minutes, >50% spent counseling

## 2021-01-18 NOTE — Patient Instructions (Signed)
Follow up as needed or as scheduled I think you are doing fabulous and I am so proud of your self awareness Continue to prioritize your physical and emotional health- you deserve it!! If your stress management methods are not helping, let me know and we can always discuss medication if we need to Call with any questions or concerns Hang in there!!!  You've got this!!!

## 2021-01-30 ENCOUNTER — Other Ambulatory Visit: Payer: Self-pay

## 2021-01-30 ENCOUNTER — Other Ambulatory Visit: Payer: Self-pay | Admitting: *Deleted

## 2021-01-30 DIAGNOSIS — Z125 Encounter for screening for malignant neoplasm of prostate: Secondary | ICD-10-CM

## 2021-01-30 NOTE — Progress Notes (Signed)
Patient: Edwin Ryan           Date of Birth: Sep 15, 1975           MRN: [id number] Visit Date: 01/30/2021 PCP: Midge Minium, MD  Prostate Cancer Screening Date of last physical exam: 04/12/20 Date of last rectal exam:  (N/A) Have you ever had any of the following?: None Have you ever had or been told you have an allergy to latex products?: No Are you currently taking any natural prostate preparations?: No Are you currently experiencing any urinary symptoms?: No  Prostate Exam Exam not completed. PSA Only.  Patient's History Patient Active Problem List   Diagnosis Date Noted   Mucous cyst of digit of left hand 01/15/2021   Sebaceous cyst 01/15/2021   Visit for preventive health examination 07/29/2016   Allergic rhinitis 12/08/2013   Bilateral foot pain 06/14/2012   Abnormality of gait 06/14/2012   Flat feet 06/03/2012   No past medical history on file.  Family History  Problem Relation Age of Onset   High blood pressure Mother    Hypertension Mother    High blood pressure Father    Diabetes Father    Heart disease Father    Hypertension Father    Hyperlipidemia Father    Stroke Father    Cancer Maternal Aunt        stomach   Cancer Maternal Uncle        lung,prostate   High blood pressure Maternal Grandmother    Stroke Maternal Grandmother    High blood pressure Maternal Grandfather    High blood pressure Paternal Grandmother    Stroke Paternal Grandmother    High blood pressure Paternal Grandfather    Heart attack Neg Hx    Sudden death Neg Hx     Social History   Occupational History   Not on file  Tobacco Use   Smoking status: Former    Types: Cigarettes    Quit date: 04/03/2012    Years since quitting: 8.8   Smokeless tobacco: Never  Vaping Use   Vaping Use: Never used  Substance and Sexual Activity   Alcohol use: Yes    Comment: 1 beer or wine per week   Drug use: No   Sexual activity: Yes

## 2021-01-31 LAB — PSA: Prostate Specific Ag, Serum: 0.2 ng/mL (ref 0.0–4.0)

## 2021-04-19 ENCOUNTER — Encounter: Payer: BC Managed Care – PPO | Admitting: Family Medicine

## 2021-06-05 ENCOUNTER — Ambulatory Visit (INDEPENDENT_AMBULATORY_CARE_PROVIDER_SITE_OTHER): Payer: BC Managed Care – PPO | Admitting: Family Medicine

## 2021-06-05 ENCOUNTER — Encounter: Payer: Self-pay | Admitting: Family Medicine

## 2021-06-05 VITALS — BP 112/74 | HR 76 | Temp 98.6°F | Resp 16 | Ht 74.0 in | Wt 188.8 lb

## 2021-06-05 DIAGNOSIS — Z1159 Encounter for screening for other viral diseases: Secondary | ICD-10-CM

## 2021-06-05 DIAGNOSIS — R739 Hyperglycemia, unspecified: Secondary | ICD-10-CM | POA: Diagnosis not present

## 2021-06-05 DIAGNOSIS — Z0000 Encounter for general adult medical examination without abnormal findings: Secondary | ICD-10-CM | POA: Diagnosis not present

## 2021-06-05 DIAGNOSIS — Z125 Encounter for screening for malignant neoplasm of prostate: Secondary | ICD-10-CM | POA: Diagnosis not present

## 2021-06-05 DIAGNOSIS — Z114 Encounter for screening for human immunodeficiency virus [HIV]: Secondary | ICD-10-CM | POA: Diagnosis not present

## 2021-06-05 LAB — HEPATIC FUNCTION PANEL
ALT: 29 U/L (ref 0–53)
AST: 19 U/L (ref 0–37)
Albumin: 4.6 g/dL (ref 3.5–5.2)
Alkaline Phosphatase: 45 U/L (ref 39–117)
Bilirubin, Direct: 0.2 mg/dL (ref 0.0–0.3)
Total Bilirubin: 1.1 mg/dL (ref 0.2–1.2)
Total Protein: 7.3 g/dL (ref 6.0–8.3)

## 2021-06-05 LAB — BASIC METABOLIC PANEL
BUN: 15 mg/dL (ref 6–23)
CO2: 29 mEq/L (ref 19–32)
Calcium: 9.7 mg/dL (ref 8.4–10.5)
Chloride: 103 mEq/L (ref 96–112)
Creatinine, Ser: 0.9 mg/dL (ref 0.40–1.50)
GFR: 102.31 mL/min (ref >60.00)
Glucose, Bld: 95 mg/dL (ref 70–99)
Potassium: 3.7 mEq/L (ref 3.5–5.1)
Sodium: 138 mEq/L (ref 135–145)

## 2021-06-05 LAB — CBC WITH DIFFERENTIAL/PLATELET
Basophils Absolute: 0 10*3/uL (ref 0.0–0.1)
Basophils Relative: 0.4 % (ref 0.0–3.0)
Eosinophils Absolute: 0.1 10*3/uL (ref 0.0–0.7)
Eosinophils Relative: 1.3 % (ref 0.0–5.0)
HCT: 43.4 % (ref 39.0–52.0)
Hemoglobin: 14.8 g/dL (ref 13.0–17.0)
Lymphocytes Relative: 42.6 % (ref 12.0–46.0)
Lymphs Abs: 1.8 10*3/uL (ref 0.7–4.0)
MCHC: 34.2 g/dL (ref 30.0–36.0)
MCV: 88.5 fl (ref 78.0–100.0)
Monocytes Absolute: 0.4 10*3/uL (ref 0.1–1.0)
Monocytes Relative: 8.3 % (ref 3.0–12.0)
Neutro Abs: 2 10*3/uL (ref 1.4–7.7)
Neutrophils Relative %: 47.4 % (ref 43.0–77.0)
Platelets: 160 10*3/uL (ref 150.0–400.0)
RBC: 4.9 Mil/uL (ref 4.22–5.81)
RDW: 12.8 % (ref 11.5–15.5)
WBC: 4.3 10*3/uL (ref 4.0–10.5)

## 2021-06-05 LAB — LIPID PANEL
Cholesterol: 191 mg/dL (ref 0–200)
HDL: 45.1 mg/dL (ref >39.00)
LDL Cholesterol: 117 mg/dL — ABNORMAL HIGH (ref 0–99)
NonHDL: 145.71
Total CHOL/HDL Ratio: 4
Triglycerides: 143 mg/dL (ref 0.0–149.0)
VLDL: 28.6 mg/dL (ref 0.0–40.0)

## 2021-06-05 LAB — TSH: TSH: 2.05 u[IU]/mL (ref 0.35–5.50)

## 2021-06-05 LAB — PSA: PSA: 0.2 ng/mL (ref 0.10–4.00)

## 2021-06-05 NOTE — Assessment & Plan Note (Signed)
Pt's PE WNL.  UTD on cologuard, Tdap.  Check labs.  Anticipatory guidance provided.

## 2021-06-05 NOTE — Patient Instructions (Signed)
Follow up in 1 year or as needed We'll notify you of your lab results and make any changes if needed Keep up the good work!  You look great!! Call with any questions or concerns Stay Safe!  Stay Healthy! Happy New Year!!!

## 2021-06-05 NOTE — Progress Notes (Signed)
° °  Subjective:    Patient ID: Edwin Ryan, male    DOB: [birth year], 46 y.o.   MRN: [id number]  HPI CPE- UTD on cologuard, Tdap.  Health Maintenance  Topic Date Due   HIV Screening  Never done   Hepatitis C Screening  Never done   COVID-19 Vaccine (3 - Booster for Moderna series) 12/07/2020   INFLUENZA VACCINE  08/02/2021 (Originally 12/03/2020)   TETANUS/TDAP  07/29/2021   Fecal DNA (Cologuard)  05/09/2023   HPV VACCINES  Aged Out      Review of Systems Patient reports no vision/hearing changes, anorexia, fever ,adenopathy, persistant/recurrent hoarseness, swallowing issues, chest pain, palpitations, edema, persistant/recurrent cough, hemoptysis, dyspnea (rest,exertional, paroxysmal nocturnal), gastrointestinal  bleeding (melena, rectal bleeding), abdominal pain, excessive heart burn, GU symptoms (dysuria, hematuria, voiding/incontinence issues) syncope, focal weakness, memory loss, numbness & tingling, skin/hair/nail changes, depression, anxiety, abnormal bruising/bleeding, musculoskeletal symptoms/signs.   This visit occurred during the SARS-CoV-2 public health emergency.  Safety protocols were in place, including screening questions prior to the visit, additional usage of staff PPE, and extensive cleaning of exam room while observing appropriate contact time as indicated for disinfecting solutions.      Objective:   Physical Exam General Appearance:    Alert, cooperative, no distress, appears stated age  Head:    Normocephalic, without obvious abnormality, atraumatic  Eyes:    PERRL, conjunctiva/corneas clear, EOM's intact, fundi    benign, both eyes       Ears:    Normal TM's and external ear canals, both ears  Nose:   Deferred due to COVID  Throat:   Neck:   Supple, symmetrical, trachea midline, no adenopathy;       thyroid:  No enlargement/tenderness/nodules  Back:     Symmetric, no curvature, ROM normal, no CVA tenderness  Lungs:     Clear to auscultation bilaterally,  respirations unlabored  Chest wall:    No tenderness or deformity  Heart:    Regular rate and rhythm, S1 and S2 normal, no murmur, rub   or gallop  Abdomen:     Soft, non-tender, bowel sounds active all four quadrants,    no masses, no organomegaly  Genitalia:    Deferred  Rectal:    Extremities:   Extremities normal, atraumatic, no cyanosis or edema  Pulses:   2+ and symmetric all extremities  Skin:   Skin color, texture, turgor normal, no rashes or lesions  Lymph nodes:   Cervical, supraclavicular, and axillary nodes normal  Neurologic:   CNII-XII intact. Normal strength, sensation and reflexes      throughout          Assessment & Plan:

## 2021-06-06 LAB — HEPATITIS C ANTIBODY
Hepatitis C Ab: NONREACTIVE
SIGNAL TO CUT-OFF: 0.05 (ref <1.00)

## 2021-06-06 LAB — HIV ANTIBODY (ROUTINE TESTING W REFLEX): HIV 1&2 Ab, 4th Generation: NONREACTIVE

## 2021-08-01 DIAGNOSIS — L814 Other melanin hyperpigmentation: Secondary | ICD-10-CM | POA: Diagnosis not present

## 2021-08-01 DIAGNOSIS — L821 Other seborrheic keratosis: Secondary | ICD-10-CM | POA: Diagnosis not present

## 2021-08-01 DIAGNOSIS — D225 Melanocytic nevi of trunk: Secondary | ICD-10-CM | POA: Diagnosis not present

## 2021-08-01 DIAGNOSIS — L609 Nail disorder, unspecified: Secondary | ICD-10-CM | POA: Diagnosis not present

## 2021-08-01 DIAGNOSIS — B351 Tinea unguium: Secondary | ICD-10-CM | POA: Diagnosis not present

## 2021-09-13 DIAGNOSIS — M9902 Segmental and somatic dysfunction of thoracic region: Secondary | ICD-10-CM | POA: Diagnosis not present

## 2021-09-13 DIAGNOSIS — M7071 Other bursitis of hip, right hip: Secondary | ICD-10-CM | POA: Diagnosis not present

## 2021-09-13 DIAGNOSIS — M9901 Segmental and somatic dysfunction of cervical region: Secondary | ICD-10-CM | POA: Diagnosis not present

## 2021-09-13 DIAGNOSIS — M9905 Segmental and somatic dysfunction of pelvic region: Secondary | ICD-10-CM | POA: Diagnosis not present

## 2021-09-13 DIAGNOSIS — M9903 Segmental and somatic dysfunction of lumbar region: Secondary | ICD-10-CM | POA: Diagnosis not present

## 2021-09-16 DIAGNOSIS — M9902 Segmental and somatic dysfunction of thoracic region: Secondary | ICD-10-CM | POA: Diagnosis not present

## 2021-09-16 DIAGNOSIS — M9905 Segmental and somatic dysfunction of pelvic region: Secondary | ICD-10-CM | POA: Diagnosis not present

## 2021-09-16 DIAGNOSIS — M9901 Segmental and somatic dysfunction of cervical region: Secondary | ICD-10-CM | POA: Diagnosis not present

## 2021-09-16 DIAGNOSIS — M9903 Segmental and somatic dysfunction of lumbar region: Secondary | ICD-10-CM | POA: Diagnosis not present

## 2021-09-16 DIAGNOSIS — M9907 Segmental and somatic dysfunction of upper extremity: Secondary | ICD-10-CM | POA: Diagnosis not present

## 2021-09-27 DIAGNOSIS — M9905 Segmental and somatic dysfunction of pelvic region: Secondary | ICD-10-CM | POA: Diagnosis not present

## 2021-09-27 DIAGNOSIS — M9903 Segmental and somatic dysfunction of lumbar region: Secondary | ICD-10-CM | POA: Diagnosis not present

## 2021-09-27 DIAGNOSIS — M9902 Segmental and somatic dysfunction of thoracic region: Secondary | ICD-10-CM | POA: Diagnosis not present

## 2021-09-27 DIAGNOSIS — M9907 Segmental and somatic dysfunction of upper extremity: Secondary | ICD-10-CM | POA: Diagnosis not present

## 2021-09-27 DIAGNOSIS — M9901 Segmental and somatic dysfunction of cervical region: Secondary | ICD-10-CM | POA: Diagnosis not present

## 2022-01-04 DIAGNOSIS — W57XXXA Bitten or stung by nonvenomous insect and other nonvenomous arthropods, initial encounter: Secondary | ICD-10-CM | POA: Diagnosis not present

## 2022-01-04 DIAGNOSIS — S20461A Insect bite (nonvenomous) of right back wall of thorax, initial encounter: Secondary | ICD-10-CM | POA: Diagnosis not present

## 2022-01-06 ENCOUNTER — Encounter: Payer: Self-pay | Admitting: Family Medicine

## 2022-01-08 MED ORDER — DOXYCYCLINE HYCLATE 100 MG PO TABS: 100.0000 mg | ORAL_TABLET | Freq: Two times a day (BID) | ORAL | 0 refills | Status: DC

## 2022-01-08 NOTE — Telephone Encounter (Signed)
Pt called b/c was told to schedule an appointment this Friday, but she is not in the office that day. Pt states that he will be out of doxycycline.  May he get a refill until he can come in for an appointment next week?

## 2022-01-08 NOTE — Telephone Encounter (Signed)
Pt sent in more photos of the rash, requesting guidance how to proceed due to no available OV

## 2022-06-06 ENCOUNTER — Encounter: Payer: Self-pay | Admitting: Family Medicine

## 2022-06-06 ENCOUNTER — Ambulatory Visit (INDEPENDENT_AMBULATORY_CARE_PROVIDER_SITE_OTHER): Payer: BC Managed Care – PPO | Admitting: Family Medicine

## 2022-06-06 VITALS — BP 110/66 | HR 63 | Temp 97.6°F | Ht 73.0 in | Wt 195.4 lb

## 2022-06-06 DIAGNOSIS — Z0000 Encounter for general adult medical examination without abnormal findings: Secondary | ICD-10-CM

## 2022-06-06 DIAGNOSIS — Z23 Encounter for immunization: Secondary | ICD-10-CM | POA: Diagnosis not present

## 2022-06-06 DIAGNOSIS — E663 Overweight: Secondary | ICD-10-CM

## 2022-06-06 DIAGNOSIS — Z125 Encounter for screening for malignant neoplasm of prostate: Secondary | ICD-10-CM

## 2022-06-06 LAB — CBC WITH DIFFERENTIAL/PLATELET
Basophils Absolute: 0 10*3/uL (ref 0.0–0.1)
Basophils Relative: 0.4 % (ref 0.0–3.0)
Eosinophils Absolute: 0.1 10*3/uL (ref 0.0–0.7)
Eosinophils Relative: 1.4 % (ref 0.0–5.0)
HCT: 43.5 % (ref 39.0–52.0)
Hemoglobin: 15 g/dL (ref 13.0–17.0)
Lymphocytes Relative: 38.8 % (ref 12.0–46.0)
Lymphs Abs: 1.9 10*3/uL (ref 0.7–4.0)
MCHC: 34.4 g/dL (ref 30.0–36.0)
MCV: 89 fl (ref 78.0–100.0)
Monocytes Absolute: 0.6 10*3/uL (ref 0.1–1.0)
Monocytes Relative: 12.2 % — ABNORMAL HIGH (ref 3.0–12.0)
Neutro Abs: 2.3 10*3/uL (ref 1.4–7.7)
Neutrophils Relative %: 47.2 % (ref 43.0–77.0)
Platelets: 156 10*3/uL (ref 150.0–400.0)
RBC: 4.89 Mil/uL (ref 4.22–5.81)
RDW: 13.4 % (ref 11.5–15.5)
WBC: 4.9 10*3/uL (ref 4.0–10.5)

## 2022-06-06 LAB — COMPREHENSIVE METABOLIC PANEL
ALT: 26 U/L (ref 0–53)
AST: 19 U/L (ref 0–37)
Albumin: 4.6 g/dL (ref 3.5–5.2)
Alkaline Phosphatase: 54 U/L (ref 39–117)
BUN: 17 mg/dL (ref 6–23)
CO2: 30 mEq/L (ref 19–32)
Calcium: 9.6 mg/dL (ref 8.4–10.5)
Chloride: 101 mEq/L (ref 96–112)
Creatinine, Ser: 0.89 mg/dL (ref 0.40–1.50)
GFR: 101.93 mL/min (ref >60.00)
Glucose, Bld: 87 mg/dL (ref 70–99)
Potassium: 3.9 mEq/L (ref 3.5–5.1)
Sodium: 139 mEq/L (ref 135–145)
Total Bilirubin: 0.8 mg/dL (ref 0.2–1.2)
Total Protein: 7.2 g/dL (ref 6.0–8.3)

## 2022-06-06 LAB — LIPID PANEL
Cholesterol: 194 mg/dL (ref 0–200)
HDL: 39.6 mg/dL (ref >39.00)
NonHDL: 154.76
Total CHOL/HDL Ratio: 5
Triglycerides: 217 mg/dL — ABNORMAL HIGH (ref 0.0–149.0)
VLDL: 43.4 mg/dL — ABNORMAL HIGH (ref 0.0–40.0)

## 2022-06-06 LAB — PSA: PSA: 0.18 ng/mL (ref 0.10–4.00)

## 2022-06-06 LAB — LDL CHOLESTEROL, DIRECT: Direct LDL: 100 mg/dL

## 2022-06-06 LAB — TSH: TSH: 2.41 u[IU]/mL (ref 0.35–5.50)

## 2022-06-06 NOTE — Progress Notes (Signed)
   Subjective:    Patient ID: Edwin Ryan, male    DOB: [birth year], 47 y.o.   MRN: [id number]  HPI CPE- UTD on cologuard, due for Tdap  Patient Care Team    Relationship Specialty Notifications Start End  Midge Minium, MD PCP - General Family Medicine  10/22/20     Health Maintenance  Topic Date Due   DTaP/Tdap/Td (2 - Td or Tdap) 07/29/2021   COVID-19 Vaccine (4 - 2023-24 season) 06/22/2022 (Originally 01/03/2022)   INFLUENZA VACCINE  08/03/2022 (Originally 12/03/2021)   Fecal DNA (Cologuard)  05/09/2023   Hepatitis C Screening  Completed   HIV Screening  Completed   HPV VACCINES  Aged Out      Review of Systems Patient reports no vision/hearing changes, anorexia, fever ,adenopathy, persistant/recurrent hoarseness, swallowing issues, chest pain, palpitations, edema, persistant/recurrent cough, hemoptysis, dyspnea (rest,exertional, paroxysmal nocturnal), gastrointestinal  bleeding (melena, rectal bleeding), abdominal pain, excessive heart burn, GU symptoms (dysuria, hematuria, voiding/incontinence issues) syncope, focal weakness, memory loss, numbness & tingling, skin/hair/nail changes, depression, anxiety, abnormal bruising/bleeding, musculoskeletal symptoms/signs.     Objective:   Physical Exam General Appearance:    Alert, cooperative, no distress, appears stated age  Head:    Normocephalic, without obvious abnormality, atraumatic  Eyes:    PERRL, conjunctiva/corneas clear, EOM's intact both eyes       Ears:    Normal TM's and external ear canals, both ears  Nose:   Nares normal, septum midline, mucosa normal, no drainage   or sinus tenderness  Throat:   Lips, mucosa, and tongue normal; teeth and gums normal  Neck:   Supple, symmetrical, trachea midline, no adenopathy;       thyroid:  No enlargement/tenderness/nodules  Back:     Symmetric, no curvature, ROM normal, no CVA tenderness  Lungs:     Clear to auscultation bilaterally, respirations unlabored  Chest wall:    No  tenderness or deformity  Heart:    Regular rate and rhythm, S1 and S2 normal, no murmur, rub   or gallop  Abdomen:     Soft, non-tender, bowel sounds active all four quadrants,    no masses, no organomegaly  Genitalia:    deferred  Rectal:    Extremities:   Extremities normal, atraumatic, no cyanosis or edema  Pulses:   2+ and symmetric all extremities  Skin:   Skin color, texture, turgor normal, no rashes or lesions  Lymph nodes:   Cervical, supraclavicular, and axillary nodes normal  Neurologic:   CNII-XII intact. Normal strength, sensation and reflexes      throughout          Assessment & Plan:

## 2022-06-06 NOTE — Patient Instructions (Signed)
Follow up in 1 year or as needed We'll notify you of your lab results and make nay changes if needed Keep up the good work on healthy diet and regular exercise- you look great! Call with any questions or concerns Stay Safe!  Stay Healthy!

## 2022-06-06 NOTE — Assessment & Plan Note (Signed)
Pt's PE WNL.  Tdap given.  Check labs.  Anticipatory guidance provided.

## 2022-06-09 ENCOUNTER — Telehealth: Payer: Self-pay

## 2022-06-09 NOTE — Telephone Encounter (Signed)
Pt seen results Via my chart  

## 2022-06-09 NOTE — Telephone Encounter (Signed)
-----   Message from Midge Minium, MD sent at 06/09/2022  7:41 AM EST ----- Labs look great!  No changes at this time

## 2022-10-19 IMAGING — DX DG CHEST 1V PORT
1 series · 1 of 1 positions shown · non-contrast
Comparison: None.

CLINICAL DATA: Shortness of breath with exertion.

EXAM:
PORTABLE CHEST 1 VIEW

[chest]
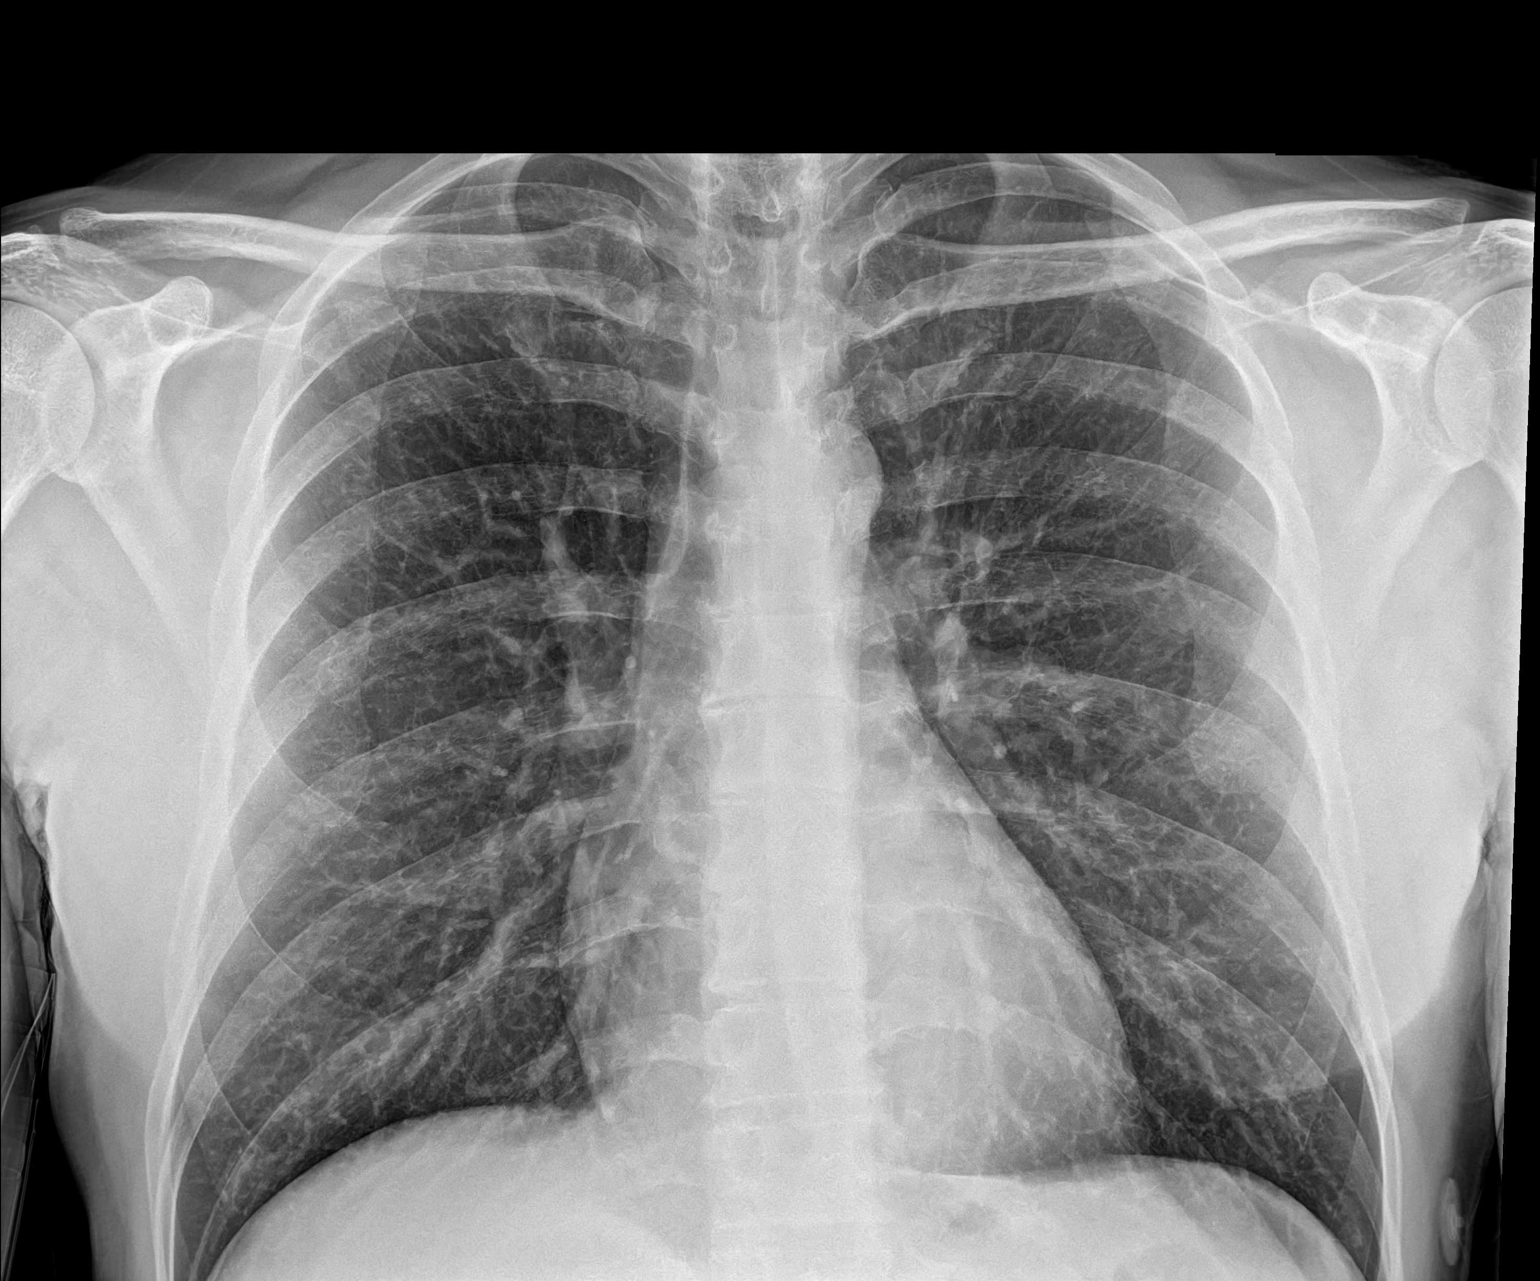

[1 of 1 positions shown; findings below may reference images not displayed]

FINDINGS: The heart size and mediastinal contours are within normal limits.
Both lungs are clear. The visualized skeletal structures are
unremarkable.
IMPRESSION: No active disease.

## 2022-12-29 ENCOUNTER — Encounter: Payer: Self-pay | Admitting: Family Medicine

## 2023-01-20 DIAGNOSIS — D492 Neoplasm of unspecified behavior of bone, soft tissue, and skin: Secondary | ICD-10-CM | POA: Diagnosis not present

## 2023-01-20 DIAGNOSIS — D225 Melanocytic nevi of trunk: Secondary | ICD-10-CM | POA: Diagnosis not present

## 2023-01-20 DIAGNOSIS — L814 Other melanin hyperpigmentation: Secondary | ICD-10-CM | POA: Diagnosis not present

## 2023-01-20 DIAGNOSIS — L821 Other seborrheic keratosis: Secondary | ICD-10-CM | POA: Diagnosis not present

## 2023-06-08 ENCOUNTER — Encounter: Payer: BC Managed Care – PPO | Admitting: Family Medicine
